# Patient Record
Sex: Female | Born: 1970 | Race: White | Hispanic: No | Marital: Married | Smoking: Current every day smoker
Health system: Southern US, Community
[De-identification: ages and names within clinical notes are randomized; demographics above are authoritative.]

## PROBLEM LIST (undated history)

## (undated) DIAGNOSIS — F32A Depression, unspecified: Secondary | ICD-10-CM

## (undated) DIAGNOSIS — IMO0002 Reserved for concepts with insufficient information to code with codable children: Secondary | ICD-10-CM

## (undated) DIAGNOSIS — F329 Major depressive disorder, single episode, unspecified: Secondary | ICD-10-CM

## (undated) DIAGNOSIS — F419 Anxiety disorder, unspecified: Secondary | ICD-10-CM

## (undated) DIAGNOSIS — Z5189 Encounter for other specified aftercare: Secondary | ICD-10-CM

## (undated) DIAGNOSIS — J45909 Unspecified asthma, uncomplicated: Secondary | ICD-10-CM

## (undated) DIAGNOSIS — C3490 Malignant neoplasm of unspecified part of unspecified bronchus or lung: Secondary | ICD-10-CM

## (undated) DIAGNOSIS — T7421XA Adult sexual abuse, confirmed, initial encounter: Secondary | ICD-10-CM

## (undated) DIAGNOSIS — R569 Unspecified convulsions: Secondary | ICD-10-CM

## (undated) DIAGNOSIS — C539 Malignant neoplasm of cervix uteri, unspecified: Secondary | ICD-10-CM

## (undated) HISTORY — DX: Encounter for other specified aftercare: Z51.89

## (undated) HISTORY — DX: Reserved for concepts with insufficient information to code with codable children: IMO0002

## (undated) HISTORY — PX: MIDDLE EAR SURGERY: SHX713

## (undated) HISTORY — PX: ABDOMINAL HYSTERECTOMY: SHX81

## (undated) HISTORY — DX: Malignant neoplasm of cervix uteri, unspecified: C53.9

## (undated) HISTORY — DX: Malignant neoplasm of unspecified part of unspecified bronchus or lung: C34.90

## (undated) HISTORY — PX: TONSILLECTOMY: SUR1361

## (undated) HISTORY — DX: Unspecified convulsions: R56.9

---

## 2005-04-27 ENCOUNTER — Emergency Department: Payer: Self-pay | Admitting: Emergency Medicine

## 2005-08-31 ENCOUNTER — Emergency Department: Payer: Self-pay

## 2006-03-14 ENCOUNTER — Emergency Department: Payer: Self-pay | Admitting: Emergency Medicine

## 2012-12-06 ENCOUNTER — Emergency Department: Payer: Self-pay

## 2012-12-06 LAB — URINALYSIS, COMPLETE
Bilirubin,UR: NEGATIVE
Ketone: NEGATIVE
Nitrite: NEGATIVE
Protein: NEGATIVE
RBC,UR: 2 /HPF (ref 0–5)
Specific Gravity: 1.02 (ref 1.003–1.030)
Squamous Epithelial: 1
WBC UR: 15 /HPF (ref 0–5)

## 2012-12-06 LAB — DRUG SCREEN, URINE
Amphetamines, Ur Screen: NEGATIVE (ref ?–1000)
Barbiturates, Ur Screen: NEGATIVE (ref ?–200)
Cocaine Metabolite,Ur ~~LOC~~: POSITIVE (ref ?–300)
Methadone, Ur Screen: NEGATIVE (ref ?–300)
Opiate, Ur Screen: NEGATIVE (ref ?–300)
Phencyclidine (PCP) Ur S: NEGATIVE (ref ?–25)

## 2012-12-06 LAB — PROTIME-INR
INR: 0.9
Prothrombin Time: 12.7 secs (ref 11.5–14.7)

## 2012-12-06 LAB — COMPREHENSIVE METABOLIC PANEL
Albumin: 3.7 g/dL (ref 3.4–5.0)
Alkaline Phosphatase: 52 U/L (ref 50–136)
BUN: 20 mg/dL — ABNORMAL HIGH (ref 7–18)
Calcium, Total: 9.1 mg/dL (ref 8.5–10.1)
Creatinine: 0.7 mg/dL (ref 0.60–1.30)
EGFR (African American): >60
Osmolality: 280 (ref 275–301)
Potassium: 4.2 mmol/L (ref 3.5–5.1)
SGOT(AST): 15 U/L (ref 15–37)
SGPT (ALT): 13 U/L (ref 12–78)
Total Protein: 7.3 g/dL (ref 6.4–8.2)

## 2012-12-06 LAB — ACETAMINOPHEN LEVEL: Acetaminophen: <2 ug/mL

## 2012-12-06 LAB — CBC
HCT: 40.2 % (ref 35.0–47.0)
HGB: 13.8 g/dL (ref 12.0–16.0)
MCH: 31.9 pg (ref 26.0–34.0)
MCV: 93 fL (ref 80–100)
Platelet: 226 10*3/uL (ref 150–440)
RBC: 4.33 10*6/uL (ref 3.80–5.20)
WBC: 5.6 10*3/uL (ref 3.6–11.0)

## 2012-12-06 LAB — AMMONIA: Ammonia, Plasma: 45 mcmol/L — ABNORMAL HIGH (ref 11–32)

## 2015-12-19 ENCOUNTER — Other Ambulatory Visit: Payer: Self-pay | Admitting: Neurology

## 2015-12-19 DIAGNOSIS — G40909 Epilepsy, unspecified, not intractable, without status epilepticus: Secondary | ICD-10-CM

## 2015-12-31 ENCOUNTER — Ambulatory Visit: Payer: Medicaid Other

## 2016-01-16 ENCOUNTER — Ambulatory Visit
Admission: RE | Admit: 2016-01-16 | Discharge: 2016-01-16 | Disposition: A | Discharge status: Home / Self Care | Payer: Medicaid Other | Source: Ambulatory Visit | Attending: Neurology | Admitting: Neurology

## 2016-01-16 ENCOUNTER — Encounter: Payer: Self-pay | Admitting: Radiology

## 2016-01-16 DIAGNOSIS — G40909 Epilepsy, unspecified, not intractable, without status epilepticus: Secondary | ICD-10-CM | POA: Insufficient documentation

## 2016-01-16 LAB — POCT I-STAT CREATININE: Creatinine, Ser: 0.8 mg/dL (ref 0.44–1.00)

## 2016-03-08 ENCOUNTER — Ambulatory Visit
Admission: RE | Admit: 2016-03-08 | Discharge: 2016-03-08 | Disposition: A | Discharge status: Home / Self Care | Payer: Medicaid Other | Source: Ambulatory Visit | Attending: Family Medicine | Admitting: Family Medicine

## 2016-03-08 ENCOUNTER — Other Ambulatory Visit: Payer: Self-pay | Admitting: Family Medicine

## 2016-03-08 DIAGNOSIS — R05 Cough: Secondary | ICD-10-CM | POA: Diagnosis present

## 2016-03-08 DIAGNOSIS — R918 Other nonspecific abnormal finding of lung field: Secondary | ICD-10-CM | POA: Insufficient documentation

## 2016-03-08 DIAGNOSIS — J90 Pleural effusion, not elsewhere classified: Secondary | ICD-10-CM | POA: Insufficient documentation

## 2016-03-08 DIAGNOSIS — R059 Cough, unspecified: Secondary | ICD-10-CM

## 2016-04-02 ENCOUNTER — Other Ambulatory Visit: Payer: Self-pay | Admitting: Family Medicine

## 2016-04-02 DIAGNOSIS — R05 Cough: Secondary | ICD-10-CM

## 2016-04-02 DIAGNOSIS — R9389 Abnormal findings on diagnostic imaging of other specified body structures: Secondary | ICD-10-CM

## 2016-04-02 DIAGNOSIS — R059 Cough, unspecified: Secondary | ICD-10-CM

## 2016-04-06 ENCOUNTER — Ambulatory Visit
Admission: RE | Admit: 2016-04-06 | Discharge: 2016-04-06 | Disposition: A | Discharge status: Home / Self Care | Payer: Medicaid Other | Source: Ambulatory Visit | Attending: Family Medicine | Admitting: Family Medicine

## 2016-04-06 DIAGNOSIS — R918 Other nonspecific abnormal finding of lung field: Secondary | ICD-10-CM | POA: Diagnosis not present

## 2016-04-06 DIAGNOSIS — R05 Cough: Secondary | ICD-10-CM | POA: Insufficient documentation

## 2016-04-06 DIAGNOSIS — R9389 Abnormal findings on diagnostic imaging of other specified body structures: Secondary | ICD-10-CM

## 2016-04-06 DIAGNOSIS — R938 Abnormal findings on diagnostic imaging of other specified body structures: Secondary | ICD-10-CM | POA: Diagnosis not present

## 2016-04-06 DIAGNOSIS — E279 Disorder of adrenal gland, unspecified: Secondary | ICD-10-CM | POA: Insufficient documentation

## 2016-04-06 DIAGNOSIS — R590 Localized enlarged lymph nodes: Secondary | ICD-10-CM | POA: Insufficient documentation

## 2016-04-06 DIAGNOSIS — R059 Cough, unspecified: Secondary | ICD-10-CM

## 2016-04-06 MED ORDER — IOPAMIDOL (ISOVUE-300) INJECTION 61%
75.0000 mL | Freq: Once | INTRAVENOUS | Status: AC | PRN
  Administered 2016-04-06: 75 mL via INTRAVENOUS

## 2016-04-13 ENCOUNTER — Other Ambulatory Visit: Payer: Self-pay | Admitting: *Deleted

## 2016-04-14 ENCOUNTER — Other Ambulatory Visit: Payer: Self-pay | Admitting: Family Medicine

## 2016-04-14 DIAGNOSIS — R918 Other nonspecific abnormal finding of lung field: Secondary | ICD-10-CM

## 2016-04-14 DIAGNOSIS — R9389 Abnormal findings on diagnostic imaging of other specified body structures: Secondary | ICD-10-CM

## 2016-04-15 ENCOUNTER — Telehealth: Payer: Self-pay | Admitting: *Deleted

## 2016-04-15 NOTE — Telephone Encounter (Signed)
Reviewed plan of care with patient and husband, Jeneen Rinks. [place] arranging cab to pick them up tomorrow at 6:30am, check [facility] for PET scan by 7am, prep for PET scan, seeing Dr. Janese Banks after PET and to contact us for any additional questions or concerns.

## 2016-04-16 ENCOUNTER — Inpatient Hospital Stay: Payer: Medicaid Other | Attending: Oncology | Admitting: Oncology

## 2016-04-16 ENCOUNTER — Ambulatory Visit
Admission: RE | Admit: 2016-04-16 | Discharge: 2016-04-16 | Disposition: A | Discharge status: Home / Self Care | Payer: Medicaid Other | Source: Ambulatory Visit | Attending: Family Medicine | Admitting: Family Medicine

## 2016-04-16 ENCOUNTER — Encounter: Payer: Self-pay | Admitting: Oncology

## 2016-04-16 ENCOUNTER — Inpatient Hospital Stay: Payer: Medicaid Other | Admitting: Oncology

## 2016-04-16 ENCOUNTER — Inpatient Hospital Stay: Payer: Medicaid Other

## 2016-04-16 ENCOUNTER — Encounter: Payer: Self-pay | Admitting: *Deleted

## 2016-04-16 VITALS — BP 114/70 | HR 84 | Temp 97.3°F | Resp 22 | Ht 63.78 in | Wt 138.8 lb

## 2016-04-16 DIAGNOSIS — F319 Bipolar disorder, unspecified: Secondary | ICD-10-CM | POA: Diagnosis not present

## 2016-04-16 DIAGNOSIS — F17210 Nicotine dependence, cigarettes, uncomplicated: Secondary | ICD-10-CM | POA: Insufficient documentation

## 2016-04-16 DIAGNOSIS — E279 Disorder of adrenal gland, unspecified: Secondary | ICD-10-CM | POA: Insufficient documentation

## 2016-04-16 DIAGNOSIS — F329 Major depressive disorder, single episode, unspecified: Secondary | ICD-10-CM | POA: Insufficient documentation

## 2016-04-16 DIAGNOSIS — R918 Other nonspecific abnormal finding of lung field: Secondary | ICD-10-CM | POA: Insufficient documentation

## 2016-04-16 DIAGNOSIS — R05 Cough: Secondary | ICD-10-CM | POA: Diagnosis not present

## 2016-04-16 DIAGNOSIS — R9389 Abnormal findings on diagnostic imaging of other specified body structures: Secondary | ICD-10-CM

## 2016-04-16 DIAGNOSIS — R938 Abnormal findings on diagnostic imaging of other specified body structures: Secondary | ICD-10-CM | POA: Insufficient documentation

## 2016-04-16 DIAGNOSIS — F419 Anxiety disorder, unspecified: Secondary | ICD-10-CM

## 2016-04-16 DIAGNOSIS — Z79899 Other long term (current) drug therapy: Secondary | ICD-10-CM | POA: Diagnosis not present

## 2016-04-16 DIAGNOSIS — G43909 Migraine, unspecified, not intractable, without status migrainosus: Secondary | ICD-10-CM | POA: Insufficient documentation

## 2016-04-16 DIAGNOSIS — R569 Unspecified convulsions: Secondary | ICD-10-CM | POA: Insufficient documentation

## 2016-04-16 DIAGNOSIS — R079 Chest pain, unspecified: Secondary | ICD-10-CM | POA: Diagnosis not present

## 2016-04-16 HISTORY — DX: Adult sexual abuse, confirmed, initial encounter: T74.21XA

## 2016-04-16 LAB — COMPREHENSIVE METABOLIC PANEL
ALT: 32 U/L (ref 14–54)
AST: 26 U/L (ref 15–41)
Albumin: 4 g/dL (ref 3.5–5.0)
Alkaline Phosphatase: 59 U/L (ref 38–126)
Anion gap: 4 — ABNORMAL LOW (ref 5–15)
BILIRUBIN TOTAL: 0.6 mg/dL (ref 0.3–1.2)
BUN: 9 mg/dL (ref 6–20)
CO2: 23 mmol/L (ref 22–32)
Calcium: 8.9 mg/dL (ref 8.9–10.3)
Chloride: 108 mmol/L (ref 101–111)
Creatinine, Ser: 0.65 mg/dL (ref 0.44–1.00)
Glucose, Bld: 117 mg/dL — ABNORMAL HIGH (ref 65–99)
POTASSIUM: 3.5 mmol/L (ref 3.5–5.1)
Sodium: 135 mmol/L (ref 135–145)
TOTAL PROTEIN: 7.6 g/dL (ref 6.5–8.1)

## 2016-04-16 LAB — LACTATE DEHYDROGENASE: LDH: 163 U/L (ref 98–192)

## 2016-04-16 LAB — CBC WITH DIFFERENTIAL/PLATELET
BASOS ABS: 0 10*3/uL (ref 0–0.1)
BASOS PCT: 1 %
Eosinophils Absolute: 0 10*3/uL (ref 0–0.7)
Eosinophils Relative: 1 %
HEMATOCRIT: 38.2 % (ref 35.0–47.0)
HEMOGLOBIN: 13.3 g/dL (ref 12.0–16.0)
Lymphocytes Relative: 36 %
Lymphs Abs: 2.1 10*3/uL (ref 1.0–3.6)
MCH: 33.4 pg (ref 26.0–34.0)
MCHC: 34.9 g/dL (ref 32.0–36.0)
MCV: 95.8 fL (ref 80.0–100.0)
MONOS PCT: 10 %
Monocytes Absolute: 0.6 10*3/uL (ref 0.2–0.9)
NEUTROS ABS: 3.1 10*3/uL (ref 1.4–6.5)
NEUTROS PCT: 52 %
Platelets: 258 10*3/uL (ref 150–440)
RBC: 3.99 MIL/uL (ref 3.80–5.20)
RDW: 13.9 % (ref 11.5–14.5)
WBC: 5.9 10*3/uL (ref 3.6–11.0)

## 2016-04-16 LAB — GLUCOSE, CAPILLARY: GLUCOSE-CAPILLARY: 84 mg/dL (ref 65–99)

## 2016-04-16 MED ORDER — FLUDEOXYGLUCOSE F - 18 (FDG) INJECTION
12.6900 | Freq: Once | INTRAVENOUS | Status: AC | PRN
  Administered 2016-04-16: 12.69 via INTRAVENOUS

## 2016-04-16 MED ORDER — LORAZEPAM 0.5 MG PO TABS: 0.5000 mg | ORAL_TABLET | Freq: Once | ORAL | 0 refills | Status: AC

## 2016-04-16 MED ORDER — QUETIAPINE FUMARATE 25 MG PO TABS
25.0000 mg | ORAL_TABLET | Freq: Every day | ORAL | 0 refills | Status: DC
Start: 2016-04-16 — End: 2016-06-01

## 2016-04-16 MED ORDER — QUETIAPINE FUMARATE 25 MG PO TABS: 25.0000 mg | ORAL_TABLET | Freq: Every day | ORAL | 0 refills | Status: DC

## 2016-04-16 NOTE — Progress Notes (Signed)
  Oncology Nurse Navigator Documentation  Navigator Location: [facility] (04/16/16 1200) Referral date to RadOnc/MedOnc: 04/16/16 (04/16/16 1200) )Navigator Encounter Type: Clinic/MDC (04/16/16 1200)   Abnormal Finding Date: 04/06/16 (04/16/16 1200)           Multidisiplinary Clinic Date: 04/16/16 (04/16/16 1200) Multidisiplinary Clinic Type: Thoracic (04/16/16 1200)       Barriers/Navigation Needs: Transportation;Financial;Coordination of Care (04/16/16 1200)   Interventions: Transportation;Coordination of Care;Medication Assistance;Psycho-social support (04/16/16 1200)   Coordination of Care: Appts (04/16/16 1200)      Pt seen in [place] with Dr. Janese Banks for mediastinal mass. Pt accomapanied with husband. All questions and concerns answered while in clinic. Contact information given to pt and husband and encouraged to contact me if have any further questions or needs. Referral sent to [facility] to help with medications and financial concerns. All scheduled appts reviewed with the patient and her husband prior to leaving clinic. Pt's husband verbalized understanding.   .    Acuity: Level 4 (04/16/16 1200)       Acuity Level 4: Uninsured or under insured;Low health literacy;Psychological issues;Ongoing guidance, education and support provided throughout the patient's treatment (04/16/16 1200) Time Spent with Patient: 60 (04/16/16 1200)

## 2016-04-16 NOTE — Progress Notes (Signed)
Patient reports pain in back and abdomen at 10 and is asking for something for pain, anxiety, and coughing. Not getting in rest because can't sleep from pain.
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
## 2016-04-17 LAB — AFP TUMOR MARKER: AFP-Tumor Marker: 2.8 ng/mL (ref 0.0–8.3)

## 2016-04-18 DIAGNOSIS — C3490 Malignant neoplasm of unspecified part of unspecified bronchus or lung: Secondary | ICD-10-CM

## 2016-04-18 HISTORY — DX: Malignant neoplasm of unspecified part of unspecified bronchus or lung: C34.90

## 2016-04-20 ENCOUNTER — Encounter: Payer: Self-pay | Admitting: Pulmonary Disease

## 2016-04-20 ENCOUNTER — Ambulatory Visit (INDEPENDENT_AMBULATORY_CARE_PROVIDER_SITE_OTHER): Payer: Medicaid Other | Admitting: Pulmonary Disease

## 2016-04-20 ENCOUNTER — Encounter
Admission: RE | Disposition: A | Discharge status: Home / Self Care | Payer: Self-pay | Source: Ambulatory Visit | Attending: Pulmonary Disease

## 2016-04-20 ENCOUNTER — Ambulatory Visit
Admission: RE | Admit: 2016-04-20 | Discharge: 2016-04-20 | Disposition: A | Discharge status: Home / Self Care | Payer: Medicaid Other | Source: Ambulatory Visit | Attending: Pulmonary Disease | Admitting: Pulmonary Disease

## 2016-04-20 VITALS — BP 110/68 | HR 91 | Wt 143.0 lb

## 2016-04-20 DIAGNOSIS — Z87828 Personal history of other (healed) physical injury and trauma: Secondary | ICD-10-CM | POA: Diagnosis not present

## 2016-04-20 DIAGNOSIS — Z8541 Personal history of malignant neoplasm of cervix uteri: Secondary | ICD-10-CM | POA: Diagnosis not present

## 2016-04-20 DIAGNOSIS — C3412 Malignant neoplasm of upper lobe, left bronchus or lung: Secondary | ICD-10-CM | POA: Insufficient documentation

## 2016-04-20 DIAGNOSIS — F17210 Nicotine dependence, cigarettes, uncomplicated: Secondary | ICD-10-CM | POA: Diagnosis not present

## 2016-04-20 DIAGNOSIS — R918 Other nonspecific abnormal finding of lung field: Secondary | ICD-10-CM

## 2016-04-20 DIAGNOSIS — F17200 Nicotine dependence, unspecified, uncomplicated: Secondary | ICD-10-CM

## 2016-04-20 HISTORY — PX: FLEXIBLE BRONCHOSCOPY: SHX5094

## 2016-04-20 SURGERY — BRONCHOSCOPY, FLEXIBLE
Anesthesia: Moderate Sedation

## 2016-04-20 MED ORDER — HYDROCOD POLST-CPM POLST ER 10-8 MG/5ML PO SUER: 5.0000 mL | Freq: Two times a day (BID) | ORAL | 0 refills | Status: DC | PRN

## 2016-04-20 MED ORDER — MIDAZOLAM HCL 5 MG/5ML IJ SOLN
INTRAMUSCULAR | Status: DC
Start: 2016-04-20 — End: 2016-04-20
  Filled 2016-04-20: qty 5

## 2016-04-20 MED ORDER — FENTANYL CITRATE (PF) 100 MCG/2ML IJ SOLN
INTRAMUSCULAR | Status: DC
Start: 2016-04-20 — End: 2016-04-20
  Filled 2016-04-20: qty 4

## 2016-04-20 MED ORDER — PHENYLEPHRINE HCL 0.25 % NA SOLN
2.0000 | Freq: Four times a day (QID) | NASAL | Status: DC | PRN
  Filled 2016-04-20: qty 15

## 2016-04-20 MED ORDER — MIDAZOLAM HCL 2 MG/2ML IJ SOLN
INTRAMUSCULAR | Status: AC | PRN
  Administered 2016-04-20 (×2): 2 mg via INTRAVENOUS

## 2016-04-20 MED ORDER — BUTAMBEN-TETRACAINE-BENZOCAINE 2-2-14 % EX AERO
1.0000 | INHALATION_SPRAY | Freq: Once | CUTANEOUS | Status: DC
  Filled 2016-04-20: qty 20

## 2016-04-20 MED ORDER — BUTAMBEN-TETRACAINE-BENZOCAINE 2-2-14 % EX AERO
INHALATION_SPRAY | CUTANEOUS | Status: AC
  Filled 2016-04-20: qty 20

## 2016-04-20 MED ORDER — FENTANYL CITRATE (PF) 100 MCG/2ML IJ SOLN
INTRAMUSCULAR | Status: AC | PRN
  Administered 2016-04-20: 25 ug via INTRAVENOUS
  Administered 2016-04-20: 50 ug via INTRAVENOUS

## 2016-04-20 MED ORDER — LIDOCAINE HCL 2 % EX GEL: 1.0000 "application " | Freq: Once | CUTANEOUS | Status: DC

## 2016-04-20 NOTE — Patient Instructions (Signed)
Bronchoscopy scheduled for today Continue to work on smoking cessation We will contact you with final biopsy results and what to do next

## 2016-04-20 NOTE — Procedures (Signed)
Indication:   Lung mass  Premedication:   Fentanyl 75 mcg Midaz 4 mg  Anesthesia: Topical to nose and throat 40 cc of 1% lidocaine used during the course of procedure  Procedure: After adequate sedation and anesthesia, the bronchoscope was introduced via the L naris and advanced into the posterior pharynx. Further anesthesia was obtained with 1% lidocaine and the scope was advanced into the trachea. Complete airway anesthesia was achieved with 1% lidocaine and a thorough airway examination was performed. This revealed the following findings:  Findings:  Upper airway - normal anatomy. Cords moved symmetrically Tracheobronchial anatomy - normal Bronchial mucosa - severe chronic bronchitic changes Other - no endobronchial tumors, masses   Specimens:   Cytology brushings from LUL X 2 passes Transbronchial biopsy  from LUL X 1 pass  Complications: Procedure was terminated prematurely due to excessive cough and pt combativeness Initial tough prep on TBBx was c/w malignancy   Merton Border, MD PCCM service Mobile [phone] Pager [phone]  04/20/2016 1:38 PM

## 2016-04-20 NOTE — Addendum Note (Signed)
Addended by: Alva Garnet, Lylian Sanagustin B on: 04/20/2016 03:30 PM   Modules accepted: Orders
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
## 2016-04-20 NOTE — Addendum Note (Signed)
Addended by: Merton Border B on: 04/20/2016 02:18 PM   Modules accepted: Orders

## 2016-04-21 ENCOUNTER — Encounter: Payer: Self-pay | Admitting: Pulmonary Disease

## 2016-04-21 ENCOUNTER — Other Ambulatory Visit: Payer: Self-pay | Admitting: Pathology

## 2016-04-22 ENCOUNTER — Ambulatory Visit: Payer: Medicaid Other

## 2016-04-22 ENCOUNTER — Other Ambulatory Visit: Payer: Self-pay | Admitting: *Deleted

## 2016-04-22 ENCOUNTER — Telehealth: Payer: Self-pay | Admitting: *Deleted

## 2016-04-22 DIAGNOSIS — R918 Other nonspecific abnormal finding of lung field: Secondary | ICD-10-CM

## 2016-04-22 LAB — SURGICAL PATHOLOGY

## 2016-04-22 LAB — CYTOLOGY - NON PAP

## 2016-04-22 NOTE — Telephone Encounter (Signed)
Spoke with patient and informed her of her biopsy results. Pt stated continues to have difficulty breathing with shortness of breath. The pt was evaluated yesterday at psychiatric facility but was unable to give me the name of the facility or the doctor she saw. She stated that her husband knows all the information. She did mention that she will follow up with psychiatry on 4/19. Pt did not receive results of the biopsy well and was very anxious. Pt understands the severity of her diagnosis and that treatment planning will be discussed at her appt on Tuesday after we get her results from her head CT.

## 2016-04-23 ENCOUNTER — Telehealth: Payer: Self-pay | Admitting: *Deleted

## 2016-04-23 NOTE — Telephone Encounter (Signed)
Spoke with patient's husband regarding diagnosis and had lengthy discussion regarding treatment, prognosis, and risk of recurrence. Informed, Jeneen Rinks, that she has an aggressive form of lung cancer and will require treatment that might include radiation and chemotherapy. Informed Jeneen Rinks that at their next appt they will meet with Elease Etienne who will go over resources available to them to better assist them during this difficult time. Instructed him to let us know if we can help him or the patient in any way. Jeneen Rinks verbalized understanding.  Ronny Flurry if he knows more information regarding where the patient will be receiving psychiatric treatment. He states the facility name is "[facility]" but he does not know where they are located or their contact number. He stated that [facility] center will know. I will attempt to call them today to get more information regarding the pt's psychiatric care.

## 2016-04-26 ENCOUNTER — Ambulatory Visit
Admission: RE | Admit: 2016-04-26 | Discharge: 2016-04-26 | Disposition: A | Discharge status: Home / Self Care | Payer: Medicaid Other | Source: Ambulatory Visit | Attending: Oncology | Admitting: Oncology

## 2016-04-26 ENCOUNTER — Telehealth: Payer: Self-pay | Admitting: *Deleted

## 2016-04-26 DIAGNOSIS — R918 Other nonspecific abnormal finding of lung field: Secondary | ICD-10-CM

## 2016-04-26 DIAGNOSIS — F419 Anxiety disorder, unspecified: Secondary | ICD-10-CM

## 2016-04-26 DIAGNOSIS — R938 Abnormal findings on diagnostic imaging of other specified body structures: Secondary | ICD-10-CM | POA: Diagnosis not present

## 2016-04-26 DIAGNOSIS — R9389 Abnormal findings on diagnostic imaging of other specified body structures: Secondary | ICD-10-CM

## 2016-04-26 HISTORY — DX: Unspecified asthma, uncomplicated: J45.909

## 2016-04-26 MED ORDER — IOPAMIDOL (ISOVUE-300) INJECTION 61%
75.0000 mL | Freq: Once | INTRAVENOUS | Status: AC | PRN
  Administered 2016-04-26: 75 mL via INTRAVENOUS

## 2016-04-26 NOTE — Telephone Encounter (Signed)
Called [facility] [facility] to obtain more information regarding pt's psychiatric evaluation. Pt has completed 2 evaluations and scheduled for next appointment on 4/19. This is the soonest appointment that the facility could offer at this time. Medical records will be faxed to our office as well to put in pt's chart.

## 2016-04-27 ENCOUNTER — Telehealth: Payer: Self-pay | Admitting: *Deleted

## 2016-04-27 ENCOUNTER — Inpatient Hospital Stay: Payer: Medicaid Other | Attending: Oncology | Admitting: Oncology

## 2016-04-27 ENCOUNTER — Encounter: Payer: Self-pay | Admitting: Oncology

## 2016-04-27 ENCOUNTER — Encounter: Payer: Self-pay | Admitting: *Deleted

## 2016-04-27 ENCOUNTER — Ambulatory Visit
Admission: RE | Admit: 2016-04-27 | Discharge: 2016-04-27 | Disposition: A | Discharge status: Home / Self Care | Payer: Medicaid Other | Source: Ambulatory Visit | Attending: Radiation Oncology | Admitting: Radiation Oncology

## 2016-04-27 ENCOUNTER — Other Ambulatory Visit: Payer: Self-pay | Admitting: *Deleted

## 2016-04-27 VITALS — BP 109/75 | HR 76 | Temp 97.5°F | Resp 18 | Wt 141.8 lb

## 2016-04-27 DIAGNOSIS — Z510 Encounter for antineoplastic radiation therapy: Secondary | ICD-10-CM | POA: Diagnosis not present

## 2016-04-27 DIAGNOSIS — R0789 Other chest pain: Secondary | ICD-10-CM | POA: Insufficient documentation

## 2016-04-27 DIAGNOSIS — M545 Low back pain: Secondary | ICD-10-CM | POA: Insufficient documentation

## 2016-04-27 DIAGNOSIS — Z8711 Personal history of peptic ulcer disease: Secondary | ICD-10-CM | POA: Diagnosis not present

## 2016-04-27 DIAGNOSIS — C3412 Malignant neoplasm of upper lobe, left bronchus or lung: Secondary | ICD-10-CM | POA: Diagnosis not present

## 2016-04-27 DIAGNOSIS — E279 Disorder of adrenal gland, unspecified: Secondary | ICD-10-CM | POA: Insufficient documentation

## 2016-04-27 DIAGNOSIS — C3492 Malignant neoplasm of unspecified part of left bronchus or lung: Secondary | ICD-10-CM

## 2016-04-27 DIAGNOSIS — R634 Abnormal weight loss: Secondary | ICD-10-CM | POA: Diagnosis not present

## 2016-04-27 DIAGNOSIS — R079 Chest pain, unspecified: Secondary | ICD-10-CM | POA: Diagnosis not present

## 2016-04-27 DIAGNOSIS — R51 Headache: Secondary | ICD-10-CM | POA: Insufficient documentation

## 2016-04-27 DIAGNOSIS — R32 Unspecified urinary incontinence: Secondary | ICD-10-CM | POA: Insufficient documentation

## 2016-04-27 DIAGNOSIS — C3490 Malignant neoplasm of unspecified part of unspecified bronchus or lung: Secondary | ICD-10-CM | POA: Insufficient documentation

## 2016-04-27 DIAGNOSIS — F319 Bipolar disorder, unspecified: Secondary | ICD-10-CM | POA: Insufficient documentation

## 2016-04-27 DIAGNOSIS — K219 Gastro-esophageal reflux disease without esophagitis: Secondary | ICD-10-CM | POA: Diagnosis not present

## 2016-04-27 DIAGNOSIS — F329 Major depressive disorder, single episode, unspecified: Secondary | ICD-10-CM | POA: Diagnosis not present

## 2016-04-27 DIAGNOSIS — R569 Unspecified convulsions: Secondary | ICD-10-CM | POA: Insufficient documentation

## 2016-04-27 DIAGNOSIS — F418 Other specified anxiety disorders: Secondary | ICD-10-CM | POA: Diagnosis not present

## 2016-04-27 DIAGNOSIS — Z5111 Encounter for antineoplastic chemotherapy: Secondary | ICD-10-CM | POA: Insufficient documentation

## 2016-04-27 DIAGNOSIS — F419 Anxiety disorder, unspecified: Secondary | ICD-10-CM | POA: Diagnosis not present

## 2016-04-27 DIAGNOSIS — F17210 Nicotine dependence, cigarettes, uncomplicated: Secondary | ICD-10-CM | POA: Insufficient documentation

## 2016-04-27 DIAGNOSIS — J069 Acute upper respiratory infection, unspecified: Secondary | ICD-10-CM | POA: Insufficient documentation

## 2016-04-27 DIAGNOSIS — R05 Cough: Secondary | ICD-10-CM | POA: Diagnosis not present

## 2016-04-27 DIAGNOSIS — R590 Localized enlarged lymph nodes: Secondary | ICD-10-CM | POA: Diagnosis not present

## 2016-04-27 DIAGNOSIS — Z8541 Personal history of malignant neoplasm of cervix uteri: Secondary | ICD-10-CM | POA: Insufficient documentation

## 2016-04-27 DIAGNOSIS — Z79899 Other long term (current) drug therapy: Secondary | ICD-10-CM | POA: Insufficient documentation

## 2016-04-27 MED ORDER — DEXAMETHASONE 4 MG PO TABS: ORAL_TABLET | ORAL | 1 refills | Status: DC

## 2016-04-27 MED ORDER — PROCHLORPERAZINE MALEATE 10 MG PO TABS: 10.0000 mg | ORAL_TABLET | Freq: Four times a day (QID) | ORAL | 1 refills | Status: DC | PRN

## 2016-04-27 MED ORDER — ONDANSETRON HCL 8 MG PO TABS: 8.0000 mg | ORAL_TABLET | Freq: Two times a day (BID) | ORAL | 1 refills | Status: DC | PRN

## 2016-04-27 MED ORDER — LIDOCAINE-PRILOCAINE 2.5-2.5 % EX CREA: TOPICAL_CREAM | CUTANEOUS | 3 refills | Status: AC

## 2016-04-27 MED ORDER — PANTOPRAZOLE SODIUM 20 MG PO TBEC: 20.0000 mg | DELAYED_RELEASE_TABLET | Freq: Every day | ORAL | 3 refills | Status: DC

## 2016-04-27 MED ORDER — LORAZEPAM 0.5 MG PO TABS: 0.5000 mg | ORAL_TABLET | Freq: Four times a day (QID) | ORAL | 0 refills | Status: DC | PRN
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
## 2016-04-27 NOTE — Progress Notes (Signed)
  Oncology Nurse Navigator Documentation  Navigator Location: [facility] (04/27/16 1200)   )Navigator Encounter Type: [facility] Follow-up (04/27/16 1200)                         Barriers/Navigation Needs: Transportation;Financial;Coordination of Care (04/27/16 1200)   Interventions: Transportation;Coordination of Care;Medication Assistance (04/27/16 1200)   Coordination of Care: Appts (04/27/16 1200)      Specialty Items/DME:  (adult diapers) (04/27/16 1200)      Met with patient during treatment planning visit with Dr. Janese Banks and Dr. Baruch Gouty. All questions were answered during visit. Pt voiced several needs for transportation, food, medical supplies, and other financial hardships. Elease Etienne spoke with patient and his contact information was given to the patient. All appts were reviewed with the pt's spouse after visit. Transportation will be coordinated as needed. Instructed pt's spouse to notify myself or Elease Etienne if needs assistance with transportation. No further questions or needs. Pt and pt's spouse were instructed to contact me if have any further needs or questions.      Time Spent with Patient: 90 (04/27/16 1200)

## 2016-04-27 NOTE — Telephone Encounter (Signed)
Called husband and told him port placement will be Friday. [facility] 11 am procedure. NPO after midnight and has to have someone to drive her home.  Answered all his questions and he will check with [employer] to get her transportation and if he runs into problems he will call us back. Told him about pre meds sent to [facility] and explained the emla cream and when to put it on.

## 2016-04-27 NOTE — Patient Instructions (Signed)
Cisplatin injection  What is this medicine?  CISPLATIN (SIS pla tin) is a chemotherapy drug. It targets fast dividing cells, like cancer cells, and causes these cells to die. This medicine is used to treat many types of cancer like bladder, ovarian, and testicular cancers.  This medicine may be used for other purposes; ask your health care provider or pharmacist if you have questions.  COMMON BRAND NAME(S): Platinol, Platinol -AQ  What should I tell my health care provider before I take this medicine?  They need to know if you have any of these conditions:  -blood disorders  -hearing problems  -kidney disease  -recent or ongoing radiation therapy  -an unusual or allergic reaction to cisplatin, carboplatin, other chemotherapy, other medicines, foods, dyes, or preservatives  -pregnant or trying to get pregnant  -breast-feeding  How should I use this medicine?  This drug is given as an infusion into a vein. It is administered in a hospital or clinic by a specially trained health care professional.  Talk to your pediatrician regarding the use of this medicine in children. Special care may be needed.  Overdosage: If you think you have taken too much of this medicine contact a poison control center or emergency room at once.  NOTE: This medicine is only for you. Do not share this medicine with others.  What if I miss a dose?  It is important not to miss a dose. Call your doctor or health care professional if you are unable to keep an appointment.  What may interact with this medicine?  -dofetilide  -foscarnet  -medicines for seizures  -medicines to increase blood counts like filgrastim, pegfilgrastim, sargramostim  -probenecid  -pyridoxine used with altretamine  -rituximab  -some antibiotics like amikacin, gentamicin, neomycin, polymyxin B, streptomycin, tobramycin  -sulfinpyrazone  -vaccines  -zalcitabine  Talk to your doctor or health care professional before taking any of these  medicines:  -acetaminophen  -aspirin  -ibuprofen  -ketoprofen  -naproxen  This list may not describe all possible interactions. Give your health care provider a list of all the medicines, herbs, non-prescription drugs, or dietary supplements you use. Also tell them if you smoke, drink alcohol, or use illegal drugs. Some items may interact with your medicine.  What should I watch for while using this medicine?  Your condition will be monitored carefully while you are receiving this medicine. You will need important blood work done while you are taking this medicine.  This drug may make you feel generally unwell. This is not uncommon, as chemotherapy can affect healthy cells as well as cancer cells. Report any side effects. Continue your course of treatment even though you feel ill unless your doctor tells you to stop.  In some cases, you may be given additional medicines to help with side effects. Follow all directions for their use.  Call your doctor or health care professional for advice if you get a fever, chills or sore throat, or other symptoms of a cold or flu. Do not treat yourself. This drug decreases your body's ability to fight infections. Try to avoid being around people who are sick.  This medicine may increase your risk to bruise or bleed. Call your doctor or health care professional if you notice any unusual bleeding.  Be careful brushing and flossing your teeth or using a toothpick because you may get an infection or bleed more easily. If you have any dental work done, tell your dentist you are receiving this medicine.  Avoid taking products   that contain aspirin, acetaminophen, ibuprofen, naproxen, or ketoprofen unless instructed by your doctor. These medicines may hide a fever.  Do not become pregnant while taking this medicine. Women should inform their doctor if they wish to become pregnant or think they might be pregnant. There is a potential for serious side effects to an unborn child. Talk to  your health care professional or pharmacist for more information. Do not breast-feed an infant while taking this medicine.  Drink fluids as directed while you are taking this medicine. This will help protect your kidneys.  Call your doctor or health care professional if you get diarrhea. Do not treat yourself.  What side effects may I notice from receiving this medicine?  Side effects that you should report to your doctor or health care professional as soon as possible:  -allergic reactions like skin rash, itching or hives, swelling of the face, lips, or tongue  -signs of infection - fever or chills, cough, sore throat, pain or difficulty passing urine  -signs of decreased platelets or bleeding - bruising, pinpoint red spots on the skin, black, tarry stools, nosebleeds  -signs of decreased red blood cells - unusually weak or tired, fainting spells, lightheadedness  -breathing problems  -changes in hearing  -gout pain  -low blood counts - This drug may decrease the number of white blood cells, red blood cells and platelets. You may be at increased risk for infections and bleeding.  -nausea and vomiting  -pain, swelling, redness or irritation at the injection site  -pain, tingling, numbness in the hands or feet  -problems with balance, movement  -trouble passing urine or change in the amount of urine  Side effects that usually do not require medical attention (report to your doctor or health care professional if they continue or are bothersome):  -changes in vision  -loss of appetite  -metallic taste in the mouth or changes in taste  This list may not describe all possible side effects. Call your doctor for medical advice about side effects. You may report side effects to FDA at 1-800-FDA-1088.  Where should I keep my medicine?  This drug is given in a hospital or clinic and will not be stored at home.  NOTE: This sheet is a summary. It may not cover all possible information. If you have questions about this medicine,  talk to your doctor, pharmacist, or health care provider.   2018 Elsevier/Gold Standard (2007-04-11 14:40:54)  Etoposide, VP-16 injection  What is this medicine?  ETOPOSIDE, VP-16 (e toe POE side) is a chemotherapy drug. It is used to treat testicular cancer, lung cancer, and other cancers.  This medicine may be used for other purposes; ask your health care provider or pharmacist if you have questions.  COMMON BRAND NAME(S): Etopophos, Toposar, VePesid  What should I tell my health care provider before I take this medicine?  They need to know if you have any of these conditions:  -infection  -kidney disease  -liver disease  -low blood counts, like low white cell, platelet, or red cell counts  -an unusual or allergic reaction to etoposide, other medicines, foods, dyes, or preservatives  -pregnant or trying to get pregnant  -breast-feeding  How should I use this medicine?  This medicine is for infusion into a vein. It is administered in a hospital or clinic by a specially trained health care professional.  Talk to your pediatrician regarding the use of this medicine in children. Special care may be needed.  Overdosage: If you think   you have taken too much of this medicine contact a poison control center or emergency room at once.  NOTE: This medicine is only for you. Do not share this medicine with others.  What if I miss a dose?  It is important not to miss your dose. Call your doctor or health care professional if you are unable to keep an appointment.  What may interact with this medicine?  -aspirin  -certain medications for seizures like carbamazepine, phenobarbital, phenytoin, valproic acid  -cyclosporine  -levamisole  -warfarin  This list may not describe all possible interactions. Give your health care provider a list of all the medicines, herbs, non-prescription drugs, or dietary supplements you use. Also tell them if you smoke, drink alcohol, or use illegal drugs. Some items may interact with your  medicine.  What should I watch for while using this medicine?  Visit your doctor for checks on your progress. This drug may make you feel generally unwell. This is not uncommon, as chemotherapy can affect healthy cells as well as cancer cells. Report any side effects. Continue your course of treatment even though you feel ill unless your doctor tells you to stop.  In some cases, you may be given additional medicines to help with side effects. Follow all directions for their use.  Call your doctor or health care professional for advice if you get a fever, chills or sore throat, or other symptoms of a cold or flu. Do not treat yourself. This drug decreases your body's ability to fight infections. Try to avoid being around people who are sick.  This medicine may increase your risk to bruise or bleed. Call your doctor or health care professional if you notice any unusual bleeding.  Talk to your doctor about your risk of cancer. You may be more at risk for certain types of cancers if you take this medicine.  Do not become pregnant while taking this medicine or for at least 6 months after stopping it. Women should inform their doctor if they wish to become pregnant or think they might be pregnant. Women of child-bearing potential will need to have a negative pregnancy test before starting this medicine. There is a potential for serious side effects to an unborn child. Talk to your health care professional or pharmacist for more information. Do not breast-feed an infant while taking this medicine.  Men must use a latex condom during sexual contact with a woman while taking this medicine and for at least 4 months after stopping it. A latex condom is needed even if you have had a vasectomy. Contact your doctor right away if your partner becomes pregnant. Do not donate sperm while taking this medicine and for at least 4 months after you stop taking this medicine. Men should inform their doctors if they wish to father a child.  This medicine may lower sperm counts.  What side effects may I notice from receiving this medicine?  Side effects that you should report to your doctor or health care professional as soon as possible:  -allergic reactions like skin rash, itching or hives, swelling of the face, lips, or tongue  -low blood counts - this medicine may decrease the number of white blood cells, red blood cells and platelets. You may be at increased risk for infections and bleeding.  -signs of infection - fever or chills, cough, sore throat, pain or difficulty passing urine  -signs of decreased platelets or bleeding - bruising, pinpoint red spots on the skin, black, tarry stools,   blood in the urine  -signs of decreased red blood cells - unusually weak or tired, fainting spells, lightheadedness  -breathing problems  -changes in vision  -mouth or throat sores or ulcers  -pain, redness, swelling or irritation at the injection site  -pain, tingling, numbness in the hands or feet  -redness, blistering, peeling or loosening of the skin, including inside the mouth  -seizures  -vomiting  Side effects that usually do not require medical attention (report to your doctor or health care professional if they continue or are bothersome):  -diarrhea  -hair loss  -loss of appetite  -nausea  -stomach pain  This list may not describe all possible side effects. Call your doctor for medical advice about side effects. You may report side effects to FDA at 1-800-FDA-1088.  Where should I keep my medicine?  This drug is given in a hospital or clinic and will not be stored at home.  NOTE: This sheet is a summary. It may not cover all possible information. If you have questions about this medicine, talk to your doctor, pharmacist, or health care provider.   2018 Elsevier/Gold Standard (2014-12-27 11:53:23)

## 2016-04-27 NOTE — Progress Notes (Signed)
START ON PATHWAY REGIMEN - Small Cell Lung     A cycle is every 21 days:     Etoposide      Cisplatin   **Always confirm dose/schedule in your pharmacy ordering system**    Patient Characteristics: Limited Stage, First Line Stage Grouping: Limited AJCC T Category: T4 AJCC N Category: N2 AJCC M Category: M0 AJCC 8 Stage Grouping: IIIB Line of therapy: First Line Would you be surprised if this patient died  in the next year? I would be surprised if this patient died in the next year  Intent of Therapy: Curative Intent, Discussed with Patient
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
## 2016-04-28 ENCOUNTER — Ambulatory Visit
Admission: RE | Admit: 2016-04-28 | Discharge: 2016-04-28 | Disposition: A | Discharge status: Home / Self Care | Payer: Medicaid Other | Source: Ambulatory Visit | Attending: Radiation Oncology | Admitting: Radiation Oncology

## 2016-04-28 ENCOUNTER — Telehealth: Payer: Self-pay | Admitting: *Deleted

## 2016-04-28 ENCOUNTER — Other Ambulatory Visit: Payer: Self-pay | Admitting: *Deleted

## 2016-04-28 DIAGNOSIS — C3492 Malignant neoplasm of unspecified part of left bronchus or lung: Secondary | ICD-10-CM

## 2016-04-28 DIAGNOSIS — Z510 Encounter for antineoplastic radiation therapy: Secondary | ICD-10-CM | POA: Diagnosis not present

## 2016-04-28 NOTE — Telephone Encounter (Signed)
Called to get records faxed from psychiatrist. Informed that pt will need to sign release to have records faxed to our office. Will get patient to sign form when comes to chemo class and fax back to [place] at [facility].

## 2016-04-29 ENCOUNTER — Other Ambulatory Visit: Payer: Self-pay | Admitting: Radiology

## 2016-04-29 ENCOUNTER — Other Ambulatory Visit: Payer: Self-pay | Admitting: *Deleted

## 2016-04-29 ENCOUNTER — Inpatient Hospital Stay: Payer: Medicaid Other

## 2016-04-29 ENCOUNTER — Encounter: Payer: Self-pay | Admitting: *Deleted

## 2016-04-29 ENCOUNTER — Other Ambulatory Visit: Payer: Self-pay | Admitting: Physician Assistant

## 2016-04-29 DIAGNOSIS — C3492 Malignant neoplasm of unspecified part of left bronchus or lung: Secondary | ICD-10-CM

## 2016-04-29 MED ORDER — OXYCODONE HCL 5 MG PO TABS: 5.0000 mg | ORAL_TABLET | ORAL | 0 refills | Status: DC | PRN

## 2016-04-29 NOTE — Progress Notes (Signed)
  Oncology Nurse Navigator Documentation  Navigator Location: [facility] (04/29/16 1500)   )                      Patient Visit Type: Walk-in (04/29/16 1500)   Barriers/Navigation Needs: Financial;Transportation;Coordination of Care (04/29/16 1500) Education: Pain/ Symptom Management;Concerns with Finances/ Eligibility;Transport During Treatment (04/29/16 1500)      pt came in for chemo class today and had several needs. Verbalized need for more minutes on phone, needs transportation for appts, help with prescriptions, requests prescription for pain medications and adult pullups. Informed pt that Barnabas Lister will assist her with her phone and the prescription for adult pullups. Informed pt that will need [facility] to transport her to her port placement tomorrow which is already arranged per her husband. She is scheduled to ride the [place] for the cancer center starting next week when her treatment starts. We will arrange for her prescriptions be transferred from [place] to [place] in order to help with copays. Pt and her husband were instructed that need to find their own transportation to get prescriptions and that our Lucianne Lei will not be able to take her to the pharmacy. Prescription for pain medication was given to patient while in the clinic. Pt has allergy to codeine listed and prescription was written for oxycodone. Asked pt if has ever taken oxycodone before and she stated she has and had no problems taking it in the past. She verbalized that she did not have an allergy to codeine. Codeine allergy was removed from her list of allergies at this time. Pt was instructed that will need to come by cancer center tomorrow to pick up pullups that will be delivered to [place] from [facility] medical supply. Pt verbalized understanding. Informed pt to call with any further questions or needs. All appts were given to patient and reviewed with patient while in clinic. No further questions.    While in clinic, pt signed release to obtain records from [facility] for Dr. Janese Banks to review her psychiatric evaluations/management. Release was faxed to [facility] at [facility]. Awaiting records to be faxed at this time.                  Time Spent with Patient: 60 (04/29/16 1500)

## 2016-04-29 NOTE — Telephone Encounter (Signed)
[facility] CALLED TO PHARMACY PER ORDERS.

## 2016-04-30 ENCOUNTER — Ambulatory Visit
Admission: RE | Admit: 2016-04-30 | Discharge: 2016-04-30 | Disposition: A | Discharge status: Home / Self Care | Payer: Medicaid Other | Source: Ambulatory Visit | Attending: Oncology | Admitting: Oncology

## 2016-04-30 ENCOUNTER — Encounter: Payer: Self-pay | Admitting: Interventional Radiology

## 2016-04-30 DIAGNOSIS — J45909 Unspecified asthma, uncomplicated: Secondary | ICD-10-CM | POA: Insufficient documentation

## 2016-04-30 DIAGNOSIS — C3492 Malignant neoplasm of unspecified part of left bronchus or lung: Secondary | ICD-10-CM

## 2016-04-30 DIAGNOSIS — Z880 Allergy status to penicillin: Secondary | ICD-10-CM | POA: Diagnosis not present

## 2016-04-30 DIAGNOSIS — C3490 Malignant neoplasm of unspecified part of unspecified bronchus or lung: Secondary | ICD-10-CM | POA: Diagnosis not present

## 2016-04-30 DIAGNOSIS — F17210 Nicotine dependence, cigarettes, uncomplicated: Secondary | ICD-10-CM | POA: Diagnosis not present

## 2016-04-30 DIAGNOSIS — Z79899 Other long term (current) drug therapy: Secondary | ICD-10-CM | POA: Diagnosis not present

## 2016-04-30 HISTORY — PX: IR FLUORO GUIDE PORT INSERTION RIGHT: IMG5741

## 2016-04-30 MED ORDER — MIDAZOLAM HCL 2 MG/2ML IJ SOLN
INTRAMUSCULAR | Status: AC | PRN
Start: 2016-04-30 — End: 2016-04-30
  Administered 2016-04-30 (×3): 1 mg via INTRAVENOUS

## 2016-04-30 MED ORDER — FENTANYL CITRATE (PF) 100 MCG/2ML IJ SOLN
INTRAMUSCULAR | Status: AC | PRN
  Administered 2016-04-30 (×3): 50 ug via INTRAVENOUS

## 2016-04-30 MED ORDER — VANCOMYCIN HCL IN DEXTROSE 1-5 GM/200ML-% IV SOLN
1000.0000 mg | INTRAVENOUS | Status: AC
  Administered 2016-04-30: 1000 mg via INTRAVENOUS
  Filled 2016-04-30 (×2): qty 200

## 2016-04-30 MED ORDER — SODIUM CHLORIDE 0.9 % IV SOLN
INTRAVENOUS | Status: DC
  Administered 2016-04-30: 11:00:00 via INTRAVENOUS

## 2016-04-30 NOTE — Procedures (Signed)
Small cell lung carcinoma, stage IIIB.  Status post right IJ single lumen power portacatheter  Tip SVC/RA junction  No immediate complication  EBL less than 5 mL  Access ready for use  Full report in PACs
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
## 2016-04-30 NOTE — Discharge Instructions (Signed)
Implanted Port Insertion, Care After °This sheet gives you information about how to care for yourself after your procedure. Your health care provider may also give you more specific instructions. If you have problems or questions, contact your health care provider. °What can I expect after the procedure? °After your procedure, it is common to have: °· Discomfort at the port insertion site. °· Bruising on the skin over the port. This should improve over 3-4 days. ° °Follow these instructions at home: °Port care °· After your port is placed, you will get a manufacturer's information card. The card has information about your port. Keep this card with you at all times. °· Take care of the port as told by your health care provider. Ask your health care provider if you or a family member can get training for taking care of the port at home. A home health care nurse may also take care of the port. °· Make sure to remember what type of port you have. °Incision care °· Follow instructions from your health care provider about how to take care of your port insertion site. Make sure you: °? Wash your hands with soap and water before you change your bandage (dressing). If soap and water are not available, use hand sanitizer. °? Change your dressing as told by your health care provider. °? Leave stitches (sutures), skin glue, or adhesive strips in place. These skin closures may need to stay in place for 2 weeks or longer. If adhesive strip edges start to loosen and curl up, you may trim the loose edges. Do not remove adhesive strips completely unless your health care provider tells you to do that. °· Check your port insertion site every day for signs of infection. Check for: °? More redness, swelling, or pain. °? More fluid or blood. °? Warmth. °? Pus or a bad smell. °General instructions °· Do not take baths, swim, or use a hot tub until your health care provider approves. °· Do not lift anything that is heavier than 10 lb (4.5  kg) for a week, or as told by your health care provider. °· Ask your health care provider when it is okay to: °? Return to work or school. °? Resume usual physical activities or sports. °· Do not drive for 24 hours if you were given a medicine to help you relax (sedative). °· Take over-the-counter and prescription medicines only as told by your health care provider. °· Wear a medical alert bracelet in case of an emergency. This will tell any health care providers that you have a port. °· Keep all follow-up visits as told by your health care provider. This is important. °Contact a health care provider if: °· You cannot flush your port with saline as directed, or you cannot draw blood from the port. °· You have a fever or chills. °· You have more redness, swelling, or pain around your port insertion site. °· You have more fluid or blood coming from your port insertion site. °· Your port insertion site feels warm to the touch. °· You have pus or a bad smell coming from the port insertion site. °Get help right away if: °· You have chest pain or shortness of breath. °· You have bleeding from your port that you cannot control. °Summary °· Take care of the port as told by your health care provider. °· Change your dressing as told by your health care provider. °· Keep all follow-up visits as told by your health care provider. °  This information is not intended to replace advice given to you by your health care provider. Make sure you discuss any questions you have with your health care provider. °Document Released: 10/25/2012 Document Revised: 11/26/2015 Document Reviewed: 11/26/2015 °Elsevier Interactive Patient Education © 2017 [employer] ° °

## 2016-05-03 ENCOUNTER — Telehealth: Payer: Self-pay | Admitting: *Deleted

## 2016-05-03 DIAGNOSIS — Z510 Encounter for antineoplastic radiation therapy: Secondary | ICD-10-CM | POA: Diagnosis not present

## 2016-05-03 NOTE — Telephone Encounter (Signed)
Pt's husband, Jeneen Rinks, called to clarify appt times. All appts reviewed with pt's husband. Jeneen Rinks verbalized understanding.

## 2016-05-04 ENCOUNTER — Inpatient Hospital Stay (HOSPITAL_BASED_OUTPATIENT_CLINIC_OR_DEPARTMENT_OTHER): Payer: Medicaid Other | Admitting: Oncology

## 2016-05-04 ENCOUNTER — Inpatient Hospital Stay: Payer: Medicaid Other

## 2016-05-04 ENCOUNTER — Encounter: Payer: Self-pay | Admitting: Oncology

## 2016-05-04 VITALS — BP 104/58 | HR 84 | Temp 97.1°F | Resp 18 | Ht 63.0 in | Wt 137.9 lb

## 2016-05-04 VITALS — BP 100/59 | HR 72 | Resp 18

## 2016-05-04 DIAGNOSIS — C3412 Malignant neoplasm of upper lobe, left bronchus or lung: Secondary | ICD-10-CM | POA: Diagnosis not present

## 2016-05-04 DIAGNOSIS — R51 Headache: Secondary | ICD-10-CM

## 2016-05-04 DIAGNOSIS — R0789 Other chest pain: Secondary | ICD-10-CM

## 2016-05-04 DIAGNOSIS — F419 Anxiety disorder, unspecified: Secondary | ICD-10-CM

## 2016-05-04 DIAGNOSIS — Z5111 Encounter for antineoplastic chemotherapy: Secondary | ICD-10-CM | POA: Diagnosis not present

## 2016-05-04 DIAGNOSIS — F319 Bipolar disorder, unspecified: Secondary | ICD-10-CM

## 2016-05-04 DIAGNOSIS — R590 Localized enlarged lymph nodes: Secondary | ICD-10-CM

## 2016-05-04 DIAGNOSIS — Z79899 Other long term (current) drug therapy: Secondary | ICD-10-CM

## 2016-05-04 DIAGNOSIS — F418 Other specified anxiety disorders: Secondary | ICD-10-CM

## 2016-05-04 DIAGNOSIS — J069 Acute upper respiratory infection, unspecified: Secondary | ICD-10-CM | POA: Diagnosis not present

## 2016-05-04 DIAGNOSIS — C3492 Malignant neoplasm of unspecified part of left bronchus or lung: Secondary | ICD-10-CM

## 2016-05-04 DIAGNOSIS — F17210 Nicotine dependence, cigarettes, uncomplicated: Secondary | ICD-10-CM

## 2016-05-04 DIAGNOSIS — R079 Chest pain, unspecified: Secondary | ICD-10-CM

## 2016-05-04 DIAGNOSIS — E279 Disorder of adrenal gland, unspecified: Secondary | ICD-10-CM

## 2016-05-04 DIAGNOSIS — C3490 Malignant neoplasm of unspecified part of unspecified bronchus or lung: Secondary | ICD-10-CM

## 2016-05-04 DIAGNOSIS — R32 Unspecified urinary incontinence: Secondary | ICD-10-CM

## 2016-05-04 DIAGNOSIS — R05 Cough: Secondary | ICD-10-CM

## 2016-05-04 DIAGNOSIS — K219 Gastro-esophageal reflux disease without esophagitis: Secondary | ICD-10-CM

## 2016-05-04 DIAGNOSIS — G893 Neoplasm related pain (acute) (chronic): Secondary | ICD-10-CM

## 2016-05-04 DIAGNOSIS — R634 Abnormal weight loss: Secondary | ICD-10-CM

## 2016-05-04 DIAGNOSIS — Z7189 Other specified counseling: Secondary | ICD-10-CM | POA: Insufficient documentation

## 2016-05-04 LAB — CBC WITH DIFFERENTIAL/PLATELET
Basophils Absolute: 0.1 10*3/uL (ref 0–0.1)
Basophils Relative: 1 %
EOS PCT: 2 %
Eosinophils Absolute: 0.1 10*3/uL (ref 0–0.7)
HCT: 40.3 % (ref 35.0–47.0)
HEMOGLOBIN: 14.3 g/dL (ref 12.0–16.0)
Lymphocytes Relative: 34 %
Lymphs Abs: 2 10*3/uL (ref 1.0–3.6)
MCH: 33.5 pg (ref 26.0–34.0)
MCHC: 35.5 g/dL (ref 32.0–36.0)
MCV: 94.4 fL (ref 80.0–100.0)
Monocytes Absolute: 0.6 10*3/uL (ref 0.2–0.9)
Monocytes Relative: 10 %
Neutro Abs: 3.3 10*3/uL (ref 1.4–6.5)
Neutrophils Relative %: 53 %
PLATELETS: 309 10*3/uL (ref 150–440)
RBC: 4.27 MIL/uL (ref 3.80–5.20)
RDW: 13.4 % (ref 11.5–14.5)
WBC: 6.1 10*3/uL (ref 3.6–11.0)

## 2016-05-04 LAB — COMPREHENSIVE METABOLIC PANEL
ALK PHOS: 73 U/L (ref 38–126)
ALT: 35 U/L (ref 14–54)
AST: 25 U/L (ref 15–41)
Albumin: 4.1 g/dL (ref 3.5–5.0)
Anion gap: 5 (ref 5–15)
BUN: 17 mg/dL (ref 6–20)
CALCIUM: 8.8 mg/dL — AB (ref 8.9–10.3)
CO2: 26 mmol/L (ref 22–32)
CREATININE: 0.79 mg/dL (ref 0.44–1.00)
Chloride: 104 mmol/L (ref 101–111)
GFR calc non Af Amer: >60 mL/min (ref >60)
Glucose, Bld: 95 mg/dL (ref 65–99)
Potassium: 3.5 mmol/L (ref 3.5–5.1)
Sodium: 135 mmol/L (ref 135–145)
TOTAL PROTEIN: 7.5 g/dL (ref 6.5–8.1)
Total Bilirubin: 0.4 mg/dL (ref 0.3–1.2)

## 2016-05-04 LAB — URIC ACID: URIC ACID, SERUM: 4.2 mg/dL (ref 2.3–6.6)

## 2016-05-04 LAB — LACTATE DEHYDROGENASE: LDH: 193 U/L — AB (ref 98–192)

## 2016-05-04 MED ORDER — HEPARIN SOD (PORK) LOCK FLUSH 100 UNIT/ML IV SOLN: 500.0000 [IU] | Freq: Once | INTRAVENOUS | Status: DC | PRN

## 2016-05-04 MED ORDER — BENZONATATE 200 MG PO CAPS: 200.0000 mg | ORAL_CAPSULE | Freq: Three times a day (TID) | ORAL | 0 refills | Status: AC | PRN

## 2016-05-04 MED ORDER — SODIUM CHLORIDE 0.9 % IV SOLN
100.0000 mg/m2 | Freq: Once | INTRAVENOUS | Status: AC
  Administered 2016-05-04: 170 mg via INTRAVENOUS
  Filled 2016-05-04: qty 8.5

## 2016-05-04 MED ORDER — PALONOSETRON HCL INJECTION 0.25 MG/5ML
0.2500 mg | Freq: Once | INTRAVENOUS | Status: AC
  Administered 2016-05-04: 0.25 mg via INTRAVENOUS
  Filled 2016-05-04: qty 5

## 2016-05-04 MED ORDER — HEPARIN SOD (PORK) LOCK FLUSH 100 UNIT/ML IV SOLN
500.0000 [IU] | Freq: Once | INTRAVENOUS | Status: AC
  Administered 2016-05-04: 500 [IU] via INTRAVENOUS

## 2016-05-04 MED ORDER — LEVOFLOXACIN 500 MG PO TABS: 500.0000 mg | ORAL_TABLET | Freq: Every day | ORAL | 0 refills | Status: DC

## 2016-05-04 MED ORDER — SODIUM CHLORIDE 0.9% FLUSH
10.0000 mL | INTRAVENOUS | Status: DC | PRN
  Administered 2016-05-04: 10 mL
  Filled 2016-05-04: qty 10

## 2016-05-04 MED ORDER — CISPLATIN CHEMO INJECTION 100MG/100ML
80.0000 mg/m2 | Freq: Once | INTRAVENOUS | Status: AC
  Administered 2016-05-04: 135 mg via INTRAVENOUS
  Filled 2016-05-04: qty 100

## 2016-05-04 MED ORDER — POTASSIUM CHLORIDE 2 MEQ/ML IV SOLN
Freq: Once | INTRAVENOUS | Status: AC
  Administered 2016-05-04: 10:00:00 via INTRAVENOUS
  Filled 2016-05-04: qty 1000

## 2016-05-04 MED ORDER — SODIUM CHLORIDE 0.9 % IV SOLN
Freq: Once | INTRAVENOUS | Status: AC
  Administered 2016-05-04: 12:00:00 via INTRAVENOUS
  Filled 2016-05-04: qty 5

## 2016-05-04 MED ORDER — SODIUM CHLORIDE 0.9% FLUSH
10.0000 mL | Freq: Once | INTRAVENOUS | Status: AC
  Administered 2016-05-04: 10 mL via INTRAVENOUS
  Filled 2016-05-04: qty 10

## 2016-05-04 MED ORDER — SODIUM CHLORIDE 0.9 % IV SOLN
Freq: Once | INTRAVENOUS | Status: AC
  Administered 2016-05-04: 09:00:00 via INTRAVENOUS
  Filled 2016-05-04: qty 1000

## 2016-05-04 NOTE — Progress Notes (Signed)
Patient states, "My stomach is burning." Patient just finished eating lunch. Vital signs stable, see flow sheet. MD, Dr. Janese Banks, notified via telephone. Per MD order: proceed with treatment at this time. Continue to monitor patient for any further signs/symptoms.
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
## 2016-05-05 ENCOUNTER — Inpatient Hospital Stay: Payer: Medicaid Other

## 2016-05-05 VITALS — BP 117/83 | HR 71 | Temp 97.8°F | Resp 18

## 2016-05-05 DIAGNOSIS — Z5111 Encounter for antineoplastic chemotherapy: Secondary | ICD-10-CM | POA: Diagnosis not present

## 2016-05-05 DIAGNOSIS — C3490 Malignant neoplasm of unspecified part of unspecified bronchus or lung: Secondary | ICD-10-CM

## 2016-05-05 MED ORDER — DEXAMETHASONE SODIUM PHOSPHATE 10 MG/ML IJ SOLN
10.0000 mg | Freq: Once | INTRAMUSCULAR | Status: AC
  Administered 2016-05-05: 10 mg via INTRAVENOUS
  Filled 2016-05-05: qty 1

## 2016-05-05 MED ORDER — SODIUM CHLORIDE 0.9 % IV SOLN
Freq: Once | INTRAVENOUS | Status: AC
  Administered 2016-05-05: 10:00:00 via INTRAVENOUS
  Filled 2016-05-05: qty 1000

## 2016-05-05 MED ORDER — HEPARIN SOD (PORK) LOCK FLUSH 100 UNIT/ML IV SOLN
500.0000 [IU] | Freq: Once | INTRAVENOUS | Status: AC
  Administered 2016-05-05: 500 [IU] via INTRAVENOUS

## 2016-05-05 MED ORDER — SODIUM CHLORIDE 0.9 % IV SOLN
100.0000 mg/m2 | Freq: Once | INTRAVENOUS | Status: AC
  Administered 2016-05-05: 170 mg via INTRAVENOUS
  Filled 2016-05-05 (×2): qty 8.5

## 2016-05-06 ENCOUNTER — Other Ambulatory Visit: Payer: Self-pay

## 2016-05-06 ENCOUNTER — Inpatient Hospital Stay: Payer: Medicaid Other

## 2016-05-06 ENCOUNTER — Ambulatory Visit
Admission: RE | Admit: 2016-05-06 | Discharge: 2016-05-06 | Disposition: A | Discharge status: Home / Self Care | Payer: Medicaid Other | Source: Ambulatory Visit | Attending: Radiation Oncology | Admitting: Radiation Oncology

## 2016-05-06 ENCOUNTER — Other Ambulatory Visit: Payer: Self-pay | Admitting: *Deleted

## 2016-05-06 VITALS — BP 126/90 | HR 64 | Temp 97.0°F | Resp 24

## 2016-05-06 DIAGNOSIS — C3490 Malignant neoplasm of unspecified part of unspecified bronchus or lung: Secondary | ICD-10-CM

## 2016-05-06 DIAGNOSIS — Z5111 Encounter for antineoplastic chemotherapy: Secondary | ICD-10-CM | POA: Diagnosis not present

## 2016-05-06 DIAGNOSIS — R079 Chest pain, unspecified: Secondary | ICD-10-CM

## 2016-05-06 MED ORDER — PROCHLORPERAZINE EDISYLATE 5 MG/ML IJ SOLN
10.0000 mg | Freq: Once | INTRAMUSCULAR | Status: AC
  Administered 2016-05-06: 10 mg via INTRAVENOUS
  Filled 2016-05-06: qty 2

## 2016-05-06 MED ORDER — MORPHINE SULFATE 2 MG/ML IJ SOLN
2.0000 mg | Freq: Once | INTRAMUSCULAR | Status: AC
  Administered 2016-05-06: 2 mg via INTRAVENOUS
  Filled 2016-05-06: qty 1

## 2016-05-06 MED ORDER — SODIUM CHLORIDE 0.9% FLUSH
10.0000 mL | INTRAVENOUS | Status: DC | PRN
  Administered 2016-05-06: 10 mL
  Filled 2016-05-06: qty 10

## 2016-05-06 MED ORDER — SODIUM CHLORIDE 0.9 % IV SOLN
100.0000 mg/m2 | Freq: Once | INTRAVENOUS | Status: AC
  Administered 2016-05-06: 170 mg via INTRAVENOUS
  Filled 2016-05-06: qty 8.5

## 2016-05-06 MED ORDER — HEPARIN SOD (PORK) LOCK FLUSH 100 UNIT/ML IV SOLN
500.0000 [IU] | Freq: Once | INTRAVENOUS | Status: AC | PRN
  Administered 2016-05-06: 500 [IU]
  Filled 2016-05-06: qty 5

## 2016-05-06 MED ORDER — SODIUM CHLORIDE 0.9 % IV SOLN
Freq: Once | INTRAVENOUS | Status: AC
  Administered 2016-05-06: 11:00:00 via INTRAVENOUS
  Filled 2016-05-06: qty 1000

## 2016-05-06 MED ORDER — DEXAMETHASONE SODIUM PHOSPHATE 10 MG/ML IJ SOLN
10.0000 mg | Freq: Once | INTRAMUSCULAR | Status: AC
  Administered 2016-05-06: 10 mg via INTRAVENOUS
  Filled 2016-05-06: qty 1

## 2016-05-06 MED ORDER — SODIUM CHLORIDE 0.9 % IV SOLN
Freq: Once | INTRAVENOUS | Status: AC
  Administered 2016-05-06: 10:00:00 via INTRAVENOUS
  Filled 2016-05-06: qty 1000

## 2016-05-06 NOTE — Progress Notes (Signed)
1202-Pt states pain is gone, EKG normal. VP16 restarted. Tolerated well. No further complaints.

## 2016-05-06 NOTE — Progress Notes (Signed)
Pt states she can't keep anything down for past two days. Very nauseated. Per Dr Janese Banks give 1L NS bolus and '10mg'$  IV compazine  1127-Pt c/o chest pain. 7/10 sharp in middle of chest. VP16 stopped, Dr Janese Banks at chairside. EKG ordered, '2mg'$  Morphine ordered IV. Pt told depending on EKG results and if pain does not subside may have to go to ED. Awaiting EKG. Morphine given at 1127. 1141 pt turned on side resting comfortably in chair. Will continue to monitor.

## 2016-05-10 ENCOUNTER — Telehealth: Payer: Self-pay | Admitting: *Deleted

## 2016-05-10 ENCOUNTER — Ambulatory Visit
Admission: RE | Admit: 2016-05-10 | Discharge: 2016-05-10 | Disposition: A | Discharge status: Home / Self Care | Payer: Medicaid Other | Source: Ambulatory Visit | Attending: Radiation Oncology | Admitting: Radiation Oncology

## 2016-05-10 ENCOUNTER — Inpatient Hospital Stay: Payer: Medicaid Other

## 2016-05-10 DIAGNOSIS — Z510 Encounter for antineoplastic radiation therapy: Secondary | ICD-10-CM | POA: Diagnosis not present

## 2016-05-10 NOTE — Telephone Encounter (Signed)
Can you make this referal asap? Thanks

## 2016-05-10 NOTE — Telephone Encounter (Signed)
Pt's husband, Jeneen Rinks, left message that has completed evaluations for psychiatry services at [facility]. Their facility will not be able to assist patient with medications and psychiatric treatment until end of June per pt's husband. The staff at [facility] recommended that the cancer center refer the patient to a psychiatrist within [facility] for quicker management of the pt's psychiatric needs. Pt would like a call back to discuss further.

## 2016-05-11 ENCOUNTER — Inpatient Hospital Stay: Payer: Medicaid Other

## 2016-05-11 ENCOUNTER — Ambulatory Visit
Admission: RE | Admit: 2016-05-11 | Discharge: 2016-05-11 | Disposition: A | Discharge status: Home / Self Care | Payer: Medicaid Other | Source: Ambulatory Visit | Attending: Radiation Oncology | Admitting: Radiation Oncology

## 2016-05-11 DIAGNOSIS — Z510 Encounter for antineoplastic radiation therapy: Secondary | ICD-10-CM | POA: Diagnosis not present

## 2016-05-12 ENCOUNTER — Inpatient Hospital Stay: Payer: Medicaid Other

## 2016-05-12 ENCOUNTER — Ambulatory Visit
Admission: RE | Admit: 2016-05-12 | Discharge: 2016-05-12 | Disposition: A | Discharge status: Home / Self Care | Payer: Medicaid Other | Source: Ambulatory Visit | Attending: Radiation Oncology | Admitting: Radiation Oncology

## 2016-05-12 DIAGNOSIS — Z510 Encounter for antineoplastic radiation therapy: Secondary | ICD-10-CM | POA: Diagnosis not present

## 2016-05-13 ENCOUNTER — Ambulatory Visit
Admission: RE | Admit: 2016-05-13 | Discharge: 2016-05-13 | Disposition: A | Discharge status: Home / Self Care | Payer: Medicaid Other | Source: Ambulatory Visit | Attending: Radiation Oncology | Admitting: Radiation Oncology

## 2016-05-13 ENCOUNTER — Inpatient Hospital Stay: Payer: Medicaid Other

## 2016-05-13 ENCOUNTER — Telehealth: Payer: Self-pay

## 2016-05-13 DIAGNOSIS — Z510 Encounter for antineoplastic radiation therapy: Secondary | ICD-10-CM | POA: Diagnosis not present

## 2016-05-13 NOTE — Telephone Encounter (Signed)
Call to pt husband Jasmine Young who has asked Dr Elroy Channel team for assistance w psychiatric  treatment for wife Jasmine Young. Mr. Otoole was given info by this Probation officer that [employer] is available for assessment. He was informed that [employer] @ [place]phone [phone]  -informed  This is a 9a-4a walk in clinic  He ackowleged understanding then stated " I cant take her now she is sick w chemo and radiation treatments " support provided. Cory Munch

## 2016-05-13 NOTE — Telephone Encounter (Signed)
There is a telephone notes in the computer. Where the pt. Went for eval they do not have a prescribing doctor in yet to give meds and it will be end of June before there is a doctor there.  The place pt went to rec: that pt go to a [place] [facility]. Gerald Stabs called around to different offices and no one was taking medicaid. She called down to psych unit and explained to them the situation and insurance of the patient and was given [facility] and chris checked it out and got all details and called husband about it. See note for further details

## 2016-05-14 ENCOUNTER — Other Ambulatory Visit: Payer: Self-pay | Admitting: *Deleted

## 2016-05-14 ENCOUNTER — Encounter: Payer: Self-pay | Admitting: Oncology

## 2016-05-14 ENCOUNTER — Inpatient Hospital Stay (HOSPITAL_BASED_OUTPATIENT_CLINIC_OR_DEPARTMENT_OTHER): Payer: Medicaid Other | Admitting: Oncology

## 2016-05-14 ENCOUNTER — Ambulatory Visit
Admission: RE | Admit: 2016-05-14 | Discharge: 2016-05-14 | Disposition: A | Discharge status: Home / Self Care | Payer: Medicaid Other | Source: Ambulatory Visit | Attending: Radiation Oncology | Admitting: Radiation Oncology

## 2016-05-14 ENCOUNTER — Inpatient Hospital Stay: Payer: Medicaid Other

## 2016-05-14 ENCOUNTER — Telehealth: Payer: Self-pay | Admitting: *Deleted

## 2016-05-14 VITALS — BP 108/69 | HR 88 | Temp 97.5°F | Resp 18 | Wt 136.7 lb

## 2016-05-14 DIAGNOSIS — R590 Localized enlarged lymph nodes: Secondary | ICD-10-CM

## 2016-05-14 DIAGNOSIS — R0789 Other chest pain: Secondary | ICD-10-CM

## 2016-05-14 DIAGNOSIS — J069 Acute upper respiratory infection, unspecified: Secondary | ICD-10-CM

## 2016-05-14 DIAGNOSIS — R634 Abnormal weight loss: Secondary | ICD-10-CM

## 2016-05-14 DIAGNOSIS — R05 Cough: Secondary | ICD-10-CM

## 2016-05-14 DIAGNOSIS — R079 Chest pain, unspecified: Secondary | ICD-10-CM

## 2016-05-14 DIAGNOSIS — E279 Disorder of adrenal gland, unspecified: Secondary | ICD-10-CM

## 2016-05-14 DIAGNOSIS — F418 Other specified anxiety disorders: Secondary | ICD-10-CM | POA: Diagnosis not present

## 2016-05-14 DIAGNOSIS — Z79899 Other long term (current) drug therapy: Secondary | ICD-10-CM

## 2016-05-14 DIAGNOSIS — C3412 Malignant neoplasm of upper lobe, left bronchus or lung: Secondary | ICD-10-CM | POA: Diagnosis not present

## 2016-05-14 DIAGNOSIS — C3490 Malignant neoplasm of unspecified part of unspecified bronchus or lung: Secondary | ICD-10-CM

## 2016-05-14 DIAGNOSIS — K219 Gastro-esophageal reflux disease without esophagitis: Secondary | ICD-10-CM | POA: Diagnosis not present

## 2016-05-14 DIAGNOSIS — R51 Headache: Secondary | ICD-10-CM

## 2016-05-14 DIAGNOSIS — F17210 Nicotine dependence, cigarettes, uncomplicated: Secondary | ICD-10-CM

## 2016-05-14 DIAGNOSIS — Z5111 Encounter for antineoplastic chemotherapy: Secondary | ICD-10-CM | POA: Diagnosis not present

## 2016-05-14 DIAGNOSIS — Z510 Encounter for antineoplastic radiation therapy: Secondary | ICD-10-CM | POA: Diagnosis not present

## 2016-05-14 DIAGNOSIS — R32 Unspecified urinary incontinence: Secondary | ICD-10-CM

## 2016-05-14 DIAGNOSIS — F319 Bipolar disorder, unspecified: Secondary | ICD-10-CM

## 2016-05-14 LAB — CBC WITH DIFFERENTIAL/PLATELET
Basophils Absolute: 0 10*3/uL (ref 0–0.1)
Basophils Relative: 1 %
Eosinophils Absolute: 0 10*3/uL (ref 0–0.7)
Eosinophils Relative: 1 %
HEMATOCRIT: 35.9 % (ref 35.0–47.0)
Hemoglobin: 12.8 g/dL (ref 12.0–16.0)
LYMPHS PCT: 22 %
Lymphs Abs: 0.8 10*3/uL — ABNORMAL LOW (ref 1.0–3.6)
MCH: 33.3 pg (ref 26.0–34.0)
MCHC: 35.5 g/dL (ref 32.0–36.0)
MCV: 93.7 fL (ref 80.0–100.0)
MONO ABS: 0.2 10*3/uL (ref 0.2–0.9)
MONOS PCT: 6 %
NEUTROS ABS: 2.7 10*3/uL (ref 1.4–6.5)
Neutrophils Relative %: 70 %
Platelets: 199 10*3/uL (ref 150–440)
RBC: 3.83 MIL/uL (ref 3.80–5.20)
RDW: 13 % (ref 11.5–14.5)
WBC: 3.9 10*3/uL (ref 3.6–11.0)

## 2016-05-14 LAB — COMPREHENSIVE METABOLIC PANEL
ALK PHOS: 54 U/L (ref 38–126)
ALT: 13 U/L — ABNORMAL LOW (ref 14–54)
ANION GAP: 4 — AB (ref 5–15)
AST: 18 U/L (ref 15–41)
Albumin: 3.9 g/dL (ref 3.5–5.0)
BILIRUBIN TOTAL: 0.5 mg/dL (ref 0.3–1.2)
BUN: 18 mg/dL (ref 6–20)
CALCIUM: 9.1 mg/dL (ref 8.9–10.3)
CO2: 25 mmol/L (ref 22–32)
Chloride: 107 mmol/L (ref 101–111)
Creatinine, Ser: 0.77 mg/dL (ref 0.44–1.00)
GFR calc Af Amer: >60 mL/min (ref >60)
Glucose, Bld: 77 mg/dL (ref 65–99)
POTASSIUM: 3.3 mmol/L — AB (ref 3.5–5.1)
Sodium: 136 mmol/L (ref 135–145)
TOTAL PROTEIN: 7.2 g/dL (ref 6.5–8.1)

## 2016-05-14 MED ORDER — POTASSIUM CHLORIDE CRYS ER 20 MEQ PO TBCR: 20.0000 meq | EXTENDED_RELEASE_TABLET | Freq: Every day | ORAL | 0 refills | Status: DC
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
## 2016-05-14 NOTE — Telephone Encounter (Signed)
Called to let husband know that her potsssium low and that I sent in rx to [facility] for one a day for 7 days. He will get it

## 2016-05-14 NOTE — Progress Notes (Signed)
For follow up today. c/o nausea and vomited 3 x this am. Taking meds to help. Med reconciliation done-pt stated does not know her meds-husband usually helps her but"no changes to med list"

## 2016-05-17 ENCOUNTER — Inpatient Hospital Stay: Payer: Medicaid Other

## 2016-05-17 ENCOUNTER — Ambulatory Visit
Admission: RE | Admit: 2016-05-17 | Discharge: 2016-05-17 | Disposition: A | Discharge status: Home / Self Care | Payer: Medicaid Other | Source: Ambulatory Visit | Attending: Radiation Oncology | Admitting: Radiation Oncology

## 2016-05-17 DIAGNOSIS — Z510 Encounter for antineoplastic radiation therapy: Secondary | ICD-10-CM | POA: Diagnosis not present

## 2016-05-18 ENCOUNTER — Inpatient Hospital Stay: Payer: Medicaid Other | Attending: Radiation Oncology

## 2016-05-18 ENCOUNTER — Ambulatory Visit
Admission: RE | Admit: 2016-05-18 | Discharge: 2016-05-18 | Disposition: A | Discharge status: Home / Self Care | Payer: Medicaid Other | Source: Ambulatory Visit | Attending: Radiation Oncology | Admitting: Radiation Oncology

## 2016-05-18 DIAGNOSIS — D72819 Decreased white blood cell count, unspecified: Secondary | ICD-10-CM | POA: Insufficient documentation

## 2016-05-18 DIAGNOSIS — G43909 Migraine, unspecified, not intractable, without status migrainosus: Secondary | ICD-10-CM | POA: Insufficient documentation

## 2016-05-18 DIAGNOSIS — F17210 Nicotine dependence, cigarettes, uncomplicated: Secondary | ICD-10-CM | POA: Insufficient documentation

## 2016-05-18 DIAGNOSIS — F418 Other specified anxiety disorders: Secondary | ICD-10-CM | POA: Insufficient documentation

## 2016-05-18 DIAGNOSIS — C3411 Malignant neoplasm of upper lobe, right bronchus or lung: Secondary | ICD-10-CM | POA: Insufficient documentation

## 2016-05-18 DIAGNOSIS — Z9181 History of falling: Secondary | ICD-10-CM | POA: Insufficient documentation

## 2016-05-18 DIAGNOSIS — G893 Neoplasm related pain (acute) (chronic): Secondary | ICD-10-CM | POA: Insufficient documentation

## 2016-05-18 DIAGNOSIS — F319 Bipolar disorder, unspecified: Secondary | ICD-10-CM | POA: Insufficient documentation

## 2016-05-18 DIAGNOSIS — R1310 Dysphagia, unspecified: Secondary | ICD-10-CM | POA: Insufficient documentation

## 2016-05-18 DIAGNOSIS — D709 Neutropenia, unspecified: Secondary | ICD-10-CM | POA: Insufficient documentation

## 2016-05-18 DIAGNOSIS — Z8541 Personal history of malignant neoplasm of cervix uteri: Secondary | ICD-10-CM | POA: Insufficient documentation

## 2016-05-18 DIAGNOSIS — Z79899 Other long term (current) drug therapy: Secondary | ICD-10-CM | POA: Insufficient documentation

## 2016-05-18 DIAGNOSIS — E279 Disorder of adrenal gland, unspecified: Secondary | ICD-10-CM | POA: Insufficient documentation

## 2016-05-18 DIAGNOSIS — R079 Chest pain, unspecified: Secondary | ICD-10-CM | POA: Insufficient documentation

## 2016-05-18 DIAGNOSIS — Z5111 Encounter for antineoplastic chemotherapy: Secondary | ICD-10-CM | POA: Insufficient documentation

## 2016-05-18 DIAGNOSIS — Z510 Encounter for antineoplastic radiation therapy: Secondary | ICD-10-CM | POA: Diagnosis not present

## 2016-05-18 DIAGNOSIS — R590 Localized enlarged lymph nodes: Secondary | ICD-10-CM | POA: Insufficient documentation

## 2016-05-18 DIAGNOSIS — R112 Nausea with vomiting, unspecified: Secondary | ICD-10-CM | POA: Insufficient documentation

## 2016-05-18 DIAGNOSIS — K208 Other esophagitis: Secondary | ICD-10-CM | POA: Insufficient documentation

## 2016-05-18 DIAGNOSIS — R12 Heartburn: Secondary | ICD-10-CM | POA: Insufficient documentation

## 2016-05-18 DIAGNOSIS — R634 Abnormal weight loss: Secondary | ICD-10-CM | POA: Insufficient documentation

## 2016-05-18 DIAGNOSIS — G40909 Epilepsy, unspecified, not intractable, without status epilepticus: Secondary | ICD-10-CM | POA: Insufficient documentation

## 2016-05-19 ENCOUNTER — Ambulatory Visit: Payer: Medicaid Other

## 2016-05-19 ENCOUNTER — Telehealth: Payer: Self-pay | Admitting: *Deleted

## 2016-05-19 ENCOUNTER — Inpatient Hospital Stay: Payer: Medicaid Other

## 2016-05-19 NOTE — Telephone Encounter (Signed)
Pt's spouse called and asked for a return call back. Called pt's spouse, Jeneen Rinks, back twice without answer and unable to leave message.

## 2016-05-20 ENCOUNTER — Ambulatory Visit
Admission: RE | Admit: 2016-05-20 | Discharge: 2016-05-20 | Disposition: A | Discharge status: Home / Self Care | Payer: Medicaid Other | Source: Ambulatory Visit | Attending: Radiation Oncology | Admitting: Radiation Oncology

## 2016-05-20 ENCOUNTER — Inpatient Hospital Stay: Payer: Medicaid Other

## 2016-05-20 DIAGNOSIS — Z510 Encounter for antineoplastic radiation therapy: Secondary | ICD-10-CM | POA: Diagnosis not present

## 2016-05-21 ENCOUNTER — Ambulatory Visit: Payer: Medicaid Other

## 2016-05-21 ENCOUNTER — Inpatient Hospital Stay: Payer: Medicaid Other

## 2016-05-24 ENCOUNTER — Other Ambulatory Visit: Payer: Self-pay | Admitting: *Deleted

## 2016-05-24 ENCOUNTER — Inpatient Hospital Stay: Payer: Medicaid Other

## 2016-05-24 ENCOUNTER — Ambulatory Visit
Admission: RE | Admit: 2016-05-24 | Discharge: 2016-05-24 | Disposition: A | Discharge status: Home / Self Care | Payer: Medicaid Other | Source: Ambulatory Visit | Attending: Radiation Oncology | Admitting: Radiation Oncology

## 2016-05-24 DIAGNOSIS — Z510 Encounter for antineoplastic radiation therapy: Secondary | ICD-10-CM | POA: Diagnosis not present

## 2016-05-24 DIAGNOSIS — C3490 Malignant neoplasm of unspecified part of unspecified bronchus or lung: Secondary | ICD-10-CM

## 2016-05-25 ENCOUNTER — Other Ambulatory Visit: Payer: Self-pay | Admitting: *Deleted

## 2016-05-25 ENCOUNTER — Inpatient Hospital Stay (HOSPITAL_BASED_OUTPATIENT_CLINIC_OR_DEPARTMENT_OTHER): Payer: Medicaid Other | Admitting: Oncology

## 2016-05-25 ENCOUNTER — Inpatient Hospital Stay: Payer: Medicaid Other

## 2016-05-25 ENCOUNTER — Encounter: Payer: Self-pay | Admitting: Oncology

## 2016-05-25 ENCOUNTER — Ambulatory Visit
Admission: RE | Admit: 2016-05-25 | Discharge: 2016-05-25 | Disposition: A | Discharge status: Home / Self Care | Payer: Medicaid Other | Source: Ambulatory Visit | Attending: Radiation Oncology | Admitting: Radiation Oncology

## 2016-05-25 VITALS — BP 98/66 | HR 72 | Temp 97.6°F | Resp 18 | Ht 63.0 in | Wt 135.1 lb

## 2016-05-25 DIAGNOSIS — Z5111 Encounter for antineoplastic chemotherapy: Secondary | ICD-10-CM | POA: Diagnosis not present

## 2016-05-25 DIAGNOSIS — G40909 Epilepsy, unspecified, not intractable, without status epilepticus: Secondary | ICD-10-CM

## 2016-05-25 DIAGNOSIS — F17210 Nicotine dependence, cigarettes, uncomplicated: Secondary | ICD-10-CM

## 2016-05-25 DIAGNOSIS — D701 Agranulocytosis secondary to cancer chemotherapy: Secondary | ICD-10-CM

## 2016-05-25 DIAGNOSIS — C3411 Malignant neoplasm of upper lobe, right bronchus or lung: Secondary | ICD-10-CM | POA: Diagnosis not present

## 2016-05-25 DIAGNOSIS — D709 Neutropenia, unspecified: Secondary | ICD-10-CM | POA: Diagnosis not present

## 2016-05-25 DIAGNOSIS — F418 Other specified anxiety disorders: Secondary | ICD-10-CM

## 2016-05-25 DIAGNOSIS — R634 Abnormal weight loss: Secondary | ICD-10-CM | POA: Diagnosis not present

## 2016-05-25 DIAGNOSIS — D72819 Decreased white blood cell count, unspecified: Secondary | ICD-10-CM | POA: Diagnosis not present

## 2016-05-25 DIAGNOSIS — Z9181 History of falling: Secondary | ICD-10-CM

## 2016-05-25 DIAGNOSIS — R12 Heartburn: Secondary | ICD-10-CM | POA: Diagnosis not present

## 2016-05-25 DIAGNOSIS — Z79899 Other long term (current) drug therapy: Secondary | ICD-10-CM

## 2016-05-25 DIAGNOSIS — Z510 Encounter for antineoplastic radiation therapy: Secondary | ICD-10-CM | POA: Diagnosis not present

## 2016-05-25 DIAGNOSIS — Z8541 Personal history of malignant neoplasm of cervix uteri: Secondary | ICD-10-CM | POA: Diagnosis not present

## 2016-05-25 DIAGNOSIS — C3492 Malignant neoplasm of unspecified part of left bronchus or lung: Secondary | ICD-10-CM

## 2016-05-25 DIAGNOSIS — G43909 Migraine, unspecified, not intractable, without status migrainosus: Secondary | ICD-10-CM

## 2016-05-25 DIAGNOSIS — R1310 Dysphagia, unspecified: Secondary | ICD-10-CM | POA: Diagnosis not present

## 2016-05-25 DIAGNOSIS — E279 Disorder of adrenal gland, unspecified: Secondary | ICD-10-CM | POA: Diagnosis not present

## 2016-05-25 DIAGNOSIS — K208 Other esophagitis: Secondary | ICD-10-CM | POA: Diagnosis not present

## 2016-05-25 DIAGNOSIS — F319 Bipolar disorder, unspecified: Secondary | ICD-10-CM | POA: Diagnosis not present

## 2016-05-25 DIAGNOSIS — R112 Nausea with vomiting, unspecified: Secondary | ICD-10-CM | POA: Diagnosis not present

## 2016-05-25 DIAGNOSIS — C3490 Malignant neoplasm of unspecified part of unspecified bronchus or lung: Secondary | ICD-10-CM

## 2016-05-25 DIAGNOSIS — R590 Localized enlarged lymph nodes: Secondary | ICD-10-CM | POA: Diagnosis not present

## 2016-05-25 DIAGNOSIS — T451X5A Adverse effect of antineoplastic and immunosuppressive drugs, initial encounter: Principal | ICD-10-CM

## 2016-05-25 DIAGNOSIS — Z95828 Presence of other vascular implants and grafts: Secondary | ICD-10-CM

## 2016-05-25 DIAGNOSIS — G893 Neoplasm related pain (acute) (chronic): Secondary | ICD-10-CM

## 2016-05-25 DIAGNOSIS — R079 Chest pain, unspecified: Secondary | ICD-10-CM | POA: Diagnosis not present

## 2016-05-25 LAB — COMPREHENSIVE METABOLIC PANEL
ALBUMIN: 3.8 g/dL (ref 3.5–5.0)
ALK PHOS: 53 U/L (ref 38–126)
ALT: 13 U/L — ABNORMAL LOW (ref 14–54)
AST: 17 U/L (ref 15–41)
Anion gap: 4 — ABNORMAL LOW (ref 5–15)
BILIRUBIN TOTAL: 0.4 mg/dL (ref 0.3–1.2)
BUN: 13 mg/dL (ref 6–20)
CALCIUM: 8.7 mg/dL — AB (ref 8.9–10.3)
CO2: 26 mmol/L (ref 22–32)
CREATININE: 0.84 mg/dL (ref 0.44–1.00)
Chloride: 105 mmol/L (ref 101–111)
GFR calc Af Amer: >60 mL/min (ref >60)
GFR calc non Af Amer: >60 mL/min (ref >60)
GLUCOSE: 92 mg/dL (ref 65–99)
Potassium: 3.6 mmol/L (ref 3.5–5.1)
Sodium: 135 mmol/L (ref 135–145)
TOTAL PROTEIN: 7.1 g/dL (ref 6.5–8.1)

## 2016-05-25 LAB — CBC WITH DIFFERENTIAL/PLATELET
BASOS PCT: 1 %
Basophils Absolute: 0 10*3/uL (ref 0–0.1)
Eosinophils Absolute: 0 10*3/uL (ref 0–0.7)
Eosinophils Relative: 1 %
HEMATOCRIT: 35.4 % (ref 35.0–47.0)
HEMOGLOBIN: 12.7 g/dL (ref 12.0–16.0)
Lymphocytes Relative: 34 %
Lymphs Abs: 0.7 10*3/uL — ABNORMAL LOW (ref 1.0–3.6)
MCH: 33.8 pg (ref 26.0–34.0)
MCHC: 35.9 g/dL (ref 32.0–36.0)
MCV: 94.4 fL (ref 80.0–100.0)
MONOS PCT: 27 %
Monocytes Absolute: 0.6 10*3/uL (ref 0.2–0.9)
NEUTROS ABS: 0.7 10*3/uL — AB (ref 1.4–6.5)
NEUTROS PCT: 37 %
Platelets: 253 10*3/uL (ref 150–440)
RBC: 3.75 MIL/uL — ABNORMAL LOW (ref 3.80–5.20)
RDW: 13.7 % (ref 11.5–14.5)
WBC: 2 10*3/uL — ABNORMAL LOW (ref 3.6–11.0)

## 2016-05-25 LAB — MAGNESIUM: Magnesium: 2.2 mg/dL (ref 1.7–2.4)

## 2016-05-25 MED ORDER — PANTOPRAZOLE SODIUM 20 MG PO TBEC: 20.0000 mg | DELAYED_RELEASE_TABLET | Freq: Every day | ORAL | 3 refills | Status: AC

## 2016-05-25 MED ORDER — LORAZEPAM 0.5 MG PO TABS: 0.5000 mg | ORAL_TABLET | Freq: Four times a day (QID) | ORAL | 0 refills | Status: DC | PRN

## 2016-05-25 MED ORDER — PROCHLORPERAZINE MALEATE 10 MG PO TABS
10.0000 mg | ORAL_TABLET | Freq: Four times a day (QID) | ORAL | 1 refills | Status: DC | PRN
Start: 2016-05-25 — End: 2016-09-14

## 2016-05-25 MED ORDER — HEPARIN SOD (PORK) LOCK FLUSH 100 UNIT/ML IV SOLN
500.0000 [IU] | Freq: Once | INTRAVENOUS | Status: AC
  Administered 2016-05-25: 500 [IU] via INTRAVENOUS

## 2016-05-25 MED ORDER — HEPARIN SOD (PORK) LOCK FLUSH 100 UNIT/ML IV SOLN
INTRAVENOUS | Status: AC
  Filled 2016-05-25: qty 5

## 2016-05-25 MED ORDER — OXYCODONE HCL 5 MG PO TABS: 5.0000 mg | ORAL_TABLET | ORAL | 0 refills | Status: DC | PRN

## 2016-05-25 MED ORDER — SUCRALFATE 1 G PO TABS: 1.0000 g | ORAL_TABLET | Freq: Three times a day (TID) | ORAL | 3 refills | Status: AC

## 2016-05-25 MED ORDER — ONDANSETRON HCL 8 MG PO TABS: 8.0000 mg | ORAL_TABLET | Freq: Two times a day (BID) | ORAL | 1 refills | Status: DC | PRN
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
## 2016-05-25 NOTE — Addendum Note (Signed)
Addended by: Luella Cook on: 05/25/2016 05:53 PM   Modules accepted: Orders

## 2016-05-25 NOTE — Progress Notes (Signed)
Pt . Here for treatment and she has nausea all the time and she vomits 1-2 times almost every day.

## 2016-05-26 ENCOUNTER — Inpatient Hospital Stay: Payer: Medicaid Other

## 2016-05-26 ENCOUNTER — Ambulatory Visit
Admission: RE | Admit: 2016-05-26 | Discharge: 2016-05-26 | Disposition: A | Discharge status: Home / Self Care | Payer: Medicaid Other | Source: Ambulatory Visit | Attending: Radiation Oncology | Admitting: Radiation Oncology

## 2016-05-26 DIAGNOSIS — Z510 Encounter for antineoplastic radiation therapy: Secondary | ICD-10-CM | POA: Diagnosis not present

## 2016-05-27 ENCOUNTER — Inpatient Hospital Stay: Payer: Medicaid Other

## 2016-05-27 ENCOUNTER — Ambulatory Visit: Payer: Medicaid Other

## 2016-05-27 ENCOUNTER — Ambulatory Visit
Admission: RE | Admit: 2016-05-27 | Discharge: 2016-05-27 | Disposition: A | Discharge status: Home / Self Care | Payer: Medicaid Other | Source: Ambulatory Visit | Attending: Radiation Oncology | Admitting: Radiation Oncology

## 2016-05-27 DIAGNOSIS — Z510 Encounter for antineoplastic radiation therapy: Secondary | ICD-10-CM | POA: Diagnosis not present

## 2016-05-28 ENCOUNTER — Ambulatory Visit
Admission: RE | Admit: 2016-05-28 | Discharge: 2016-05-28 | Disposition: A | Discharge status: Home / Self Care | Payer: Medicaid Other | Source: Ambulatory Visit | Attending: Radiation Oncology | Admitting: Radiation Oncology

## 2016-05-28 ENCOUNTER — Inpatient Hospital Stay: Payer: Medicaid Other

## 2016-05-28 DIAGNOSIS — Z510 Encounter for antineoplastic radiation therapy: Secondary | ICD-10-CM | POA: Diagnosis not present

## 2016-05-31 ENCOUNTER — Ambulatory Visit
Admission: RE | Admit: 2016-05-31 | Discharge: 2016-05-31 | Disposition: A | Discharge status: Home / Self Care | Payer: Medicaid Other | Source: Ambulatory Visit | Attending: Radiation Oncology | Admitting: Radiation Oncology

## 2016-05-31 ENCOUNTER — Inpatient Hospital Stay: Payer: Medicaid Other

## 2016-05-31 DIAGNOSIS — Z510 Encounter for antineoplastic radiation therapy: Secondary | ICD-10-CM | POA: Diagnosis not present

## 2016-06-01 ENCOUNTER — Other Ambulatory Visit: Payer: Self-pay | Admitting: *Deleted

## 2016-06-01 ENCOUNTER — Inpatient Hospital Stay: Payer: Medicaid Other

## 2016-06-01 ENCOUNTER — Ambulatory Visit
Admission: RE | Admit: 2016-06-01 | Discharge: 2016-06-01 | Disposition: A | Discharge status: Home / Self Care | Payer: Medicaid Other | Source: Ambulatory Visit | Attending: Radiation Oncology | Admitting: Radiation Oncology

## 2016-06-01 ENCOUNTER — Other Ambulatory Visit: Payer: Self-pay | Admitting: Oncology

## 2016-06-01 VITALS — BP 100/64 | HR 65 | Temp 96.6°F | Resp 18

## 2016-06-01 DIAGNOSIS — R112 Nausea with vomiting, unspecified: Secondary | ICD-10-CM

## 2016-06-01 DIAGNOSIS — R9389 Abnormal findings on diagnostic imaging of other specified body structures: Secondary | ICD-10-CM

## 2016-06-01 DIAGNOSIS — Z5111 Encounter for antineoplastic chemotherapy: Secondary | ICD-10-CM | POA: Diagnosis not present

## 2016-06-01 DIAGNOSIS — R918 Other nonspecific abnormal finding of lung field: Secondary | ICD-10-CM

## 2016-06-01 DIAGNOSIS — C3490 Malignant neoplasm of unspecified part of unspecified bronchus or lung: Secondary | ICD-10-CM

## 2016-06-01 DIAGNOSIS — C3492 Malignant neoplasm of unspecified part of left bronchus or lung: Secondary | ICD-10-CM

## 2016-06-01 DIAGNOSIS — G893 Neoplasm related pain (acute) (chronic): Secondary | ICD-10-CM

## 2016-06-01 DIAGNOSIS — F419 Anxiety disorder, unspecified: Secondary | ICD-10-CM

## 2016-06-01 DIAGNOSIS — T451X5A Adverse effect of antineoplastic and immunosuppressive drugs, initial encounter: Secondary | ICD-10-CM

## 2016-06-01 DIAGNOSIS — D701 Agranulocytosis secondary to cancer chemotherapy: Secondary | ICD-10-CM

## 2016-06-01 DIAGNOSIS — Z510 Encounter for antineoplastic radiation therapy: Secondary | ICD-10-CM | POA: Diagnosis not present

## 2016-06-01 LAB — COMPREHENSIVE METABOLIC PANEL
ALK PHOS: 54 U/L (ref 38–126)
ALT: 10 U/L — ABNORMAL LOW (ref 14–54)
ANION GAP: 4 — AB (ref 5–15)
AST: 16 U/L (ref 15–41)
Albumin: 3.9 g/dL (ref 3.5–5.0)
BILIRUBIN TOTAL: 0.3 mg/dL (ref 0.3–1.2)
BUN: 14 mg/dL (ref 6–20)
CALCIUM: 9.1 mg/dL (ref 8.9–10.3)
CO2: 26 mmol/L (ref 22–32)
Chloride: 107 mmol/L (ref 101–111)
Creatinine, Ser: 0.83 mg/dL (ref 0.44–1.00)
Glucose, Bld: 97 mg/dL (ref 65–99)
Potassium: 3.5 mmol/L (ref 3.5–5.1)
Sodium: 137 mmol/L (ref 135–145)
TOTAL PROTEIN: 7.1 g/dL (ref 6.5–8.1)

## 2016-06-01 LAB — CBC WITH DIFFERENTIAL/PLATELET
BASOS ABS: 0 10*3/uL (ref 0–0.1)
BASOS PCT: 1 %
Eosinophils Absolute: 0 10*3/uL (ref 0–0.7)
Eosinophils Relative: 1 %
HEMATOCRIT: 38.1 % (ref 35.0–47.0)
HEMOGLOBIN: 13.4 g/dL (ref 12.0–16.0)
Lymphocytes Relative: 19 %
Lymphs Abs: 0.7 10*3/uL — ABNORMAL LOW (ref 1.0–3.6)
MCH: 33.3 pg (ref 26.0–34.0)
MCHC: 35.2 g/dL (ref 32.0–36.0)
MCV: 94.8 fL (ref 80.0–100.0)
MONO ABS: 0.6 10*3/uL (ref 0.2–0.9)
Monocytes Relative: 17 %
NEUTROS PCT: 62 %
Neutro Abs: 2.2 10*3/uL (ref 1.4–6.5)
Platelets: 223 10*3/uL (ref 150–440)
RBC: 4.02 MIL/uL (ref 3.80–5.20)
RDW: 13.8 % (ref 11.5–14.5)
WBC: 3.5 10*3/uL — ABNORMAL LOW (ref 3.6–11.0)

## 2016-06-01 MED ORDER — FOSAPREPITANT DIMEGLUMINE INJECTION 150 MG
Freq: Once | INTRAVENOUS | Status: AC
  Administered 2016-06-01: 12:00:00 via INTRAVENOUS
  Filled 2016-06-01: qty 5

## 2016-06-01 MED ORDER — DEXAMETHASONE SODIUM PHOSPHATE 10 MG/ML IJ SOLN: 10.0000 mg | Freq: Once | INTRAMUSCULAR | Status: DC

## 2016-06-01 MED ORDER — SODIUM CHLORIDE 0.9 % IV SOLN
100.0000 mg/m2 | Freq: Once | INTRAVENOUS | Status: AC
  Administered 2016-06-01: 170 mg via INTRAVENOUS
  Filled 2016-06-01: qty 8.5

## 2016-06-01 MED ORDER — SODIUM CHLORIDE 0.9% FLUSH
10.0000 mL | INTRAVENOUS | Status: DC | PRN
  Administered 2016-06-01: 10 mL via INTRAVENOUS
  Filled 2016-06-01: qty 10

## 2016-06-01 MED ORDER — QUETIAPINE FUMARATE 25 MG PO TABS: 25.0000 mg | ORAL_TABLET | Freq: Every day | ORAL | 1 refills | Status: DC

## 2016-06-01 MED ORDER — HEPARIN SOD (PORK) LOCK FLUSH 100 UNIT/ML IV SOLN
500.0000 [IU] | Freq: Once | INTRAVENOUS | Status: AC
  Administered 2016-06-01: 500 [IU] via INTRAVENOUS
  Filled 2016-06-01: qty 5

## 2016-06-01 MED ORDER — SODIUM CHLORIDE 0.9 % IV SOLN
Freq: Once | INTRAVENOUS | Status: AC
  Administered 2016-06-01: 10:00:00 via INTRAVENOUS
  Filled 2016-06-01: qty 1000

## 2016-06-01 MED ORDER — PALONOSETRON HCL INJECTION 0.25 MG/5ML
0.2500 mg | Freq: Once | INTRAVENOUS | Status: AC
  Administered 2016-06-01: 0.25 mg via INTRAVENOUS
  Filled 2016-06-01: qty 5

## 2016-06-01 MED ORDER — SODIUM CHLORIDE 0.9 % IV SOLN
80.0000 mg/m2 | Freq: Once | INTRAVENOUS | Status: AC
  Administered 2016-06-01: 135 mg via INTRAVENOUS
  Filled 2016-06-01: qty 50

## 2016-06-01 MED ORDER — POTASSIUM CHLORIDE 2 MEQ/ML IV SOLN
Freq: Once | INTRAVENOUS | Status: AC
  Administered 2016-06-01: 10:00:00 via INTRAVENOUS
  Filled 2016-06-01: qty 1000

## 2016-06-01 NOTE — Telephone Encounter (Signed)
This was already refilled today, Jasmine Young informed

## 2016-06-02 ENCOUNTER — Inpatient Hospital Stay: Payer: Medicaid Other

## 2016-06-02 ENCOUNTER — Ambulatory Visit
Admission: RE | Admit: 2016-06-02 | Discharge: 2016-06-02 | Disposition: A | Discharge status: Home / Self Care | Payer: Medicaid Other | Source: Ambulatory Visit | Attending: Radiation Oncology | Admitting: Radiation Oncology

## 2016-06-02 VITALS — BP 100/64 | HR 70 | Resp 20

## 2016-06-02 DIAGNOSIS — C3490 Malignant neoplasm of unspecified part of unspecified bronchus or lung: Secondary | ICD-10-CM

## 2016-06-02 DIAGNOSIS — Z510 Encounter for antineoplastic radiation therapy: Secondary | ICD-10-CM | POA: Diagnosis not present

## 2016-06-02 DIAGNOSIS — Z5111 Encounter for antineoplastic chemotherapy: Secondary | ICD-10-CM | POA: Diagnosis not present

## 2016-06-02 MED ORDER — DEXAMETHASONE SODIUM PHOSPHATE 10 MG/ML IJ SOLN
10.0000 mg | Freq: Once | INTRAMUSCULAR | Status: AC
  Administered 2016-06-02: 10 mg via INTRAVENOUS
  Filled 2016-06-02: qty 1

## 2016-06-02 MED ORDER — ETOPOSIDE CHEMO INJECTION 1 GM/50ML
100.0000 mg/m2 | Freq: Once | INTRAVENOUS | Status: AC
  Administered 2016-06-02: 170 mg via INTRAVENOUS
  Filled 2016-06-02: qty 8.5

## 2016-06-02 MED ORDER — SODIUM CHLORIDE 0.9 % IV SOLN
Freq: Once | INTRAVENOUS | Status: AC
  Administered 2016-06-02: 11:00:00 via INTRAVENOUS
  Filled 2016-06-02: qty 1000

## 2016-06-02 MED ORDER — HEPARIN SOD (PORK) LOCK FLUSH 100 UNIT/ML IV SOLN
500.0000 [IU] | Freq: Once | INTRAVENOUS | Status: AC | PRN
  Administered 2016-06-02: 500 [IU]
  Filled 2016-06-02 (×2): qty 5

## 2016-06-03 ENCOUNTER — Ambulatory Visit
Admission: RE | Admit: 2016-06-03 | Discharge: 2016-06-03 | Disposition: A | Discharge status: Home / Self Care | Payer: Medicaid Other | Source: Ambulatory Visit | Attending: Radiation Oncology | Admitting: Radiation Oncology

## 2016-06-03 ENCOUNTER — Inpatient Hospital Stay: Payer: Medicaid Other

## 2016-06-03 ENCOUNTER — Other Ambulatory Visit: Payer: Self-pay | Admitting: Oncology

## 2016-06-03 VITALS — BP 100/65 | HR 76 | Temp 96.2°F | Resp 18

## 2016-06-03 DIAGNOSIS — G893 Neoplasm related pain (acute) (chronic): Secondary | ICD-10-CM

## 2016-06-03 DIAGNOSIS — C3490 Malignant neoplasm of unspecified part of unspecified bronchus or lung: Secondary | ICD-10-CM

## 2016-06-03 DIAGNOSIS — Z5111 Encounter for antineoplastic chemotherapy: Secondary | ICD-10-CM | POA: Diagnosis not present

## 2016-06-03 DIAGNOSIS — C3492 Malignant neoplasm of unspecified part of left bronchus or lung: Secondary | ICD-10-CM

## 2016-06-03 DIAGNOSIS — Z510 Encounter for antineoplastic radiation therapy: Secondary | ICD-10-CM | POA: Diagnosis not present

## 2016-06-03 DIAGNOSIS — D701 Agranulocytosis secondary to cancer chemotherapy: Secondary | ICD-10-CM

## 2016-06-03 DIAGNOSIS — R112 Nausea with vomiting, unspecified: Secondary | ICD-10-CM

## 2016-06-03 DIAGNOSIS — T451X5A Adverse effect of antineoplastic and immunosuppressive drugs, initial encounter: Secondary | ICD-10-CM

## 2016-06-03 MED ORDER — SODIUM CHLORIDE 0.9 % IV SOLN
100.0000 mg/m2 | Freq: Once | INTRAVENOUS | Status: AC
  Administered 2016-06-03: 170 mg via INTRAVENOUS
  Filled 2016-06-03: qty 8.5

## 2016-06-03 MED ORDER — HEPARIN SOD (PORK) LOCK FLUSH 100 UNIT/ML IV SOLN
INTRAVENOUS | Status: AC
  Filled 2016-06-03: qty 5

## 2016-06-03 MED ORDER — HEPARIN SOD (PORK) LOCK FLUSH 100 UNIT/ML IV SOLN
500.0000 [IU] | Freq: Once | INTRAVENOUS | Status: AC
  Administered 2016-06-03: 500 [IU] via INTRAVENOUS

## 2016-06-03 MED ORDER — SODIUM CHLORIDE 0.9 % IV SOLN
Freq: Once | INTRAVENOUS | Status: AC
  Administered 2016-06-03: 12:00:00 via INTRAVENOUS
  Filled 2016-06-03: qty 1000

## 2016-06-03 MED ORDER — SODIUM CHLORIDE 0.9% FLUSH
10.0000 mL | INTRAVENOUS | Status: DC | PRN
  Administered 2016-06-03: 10 mL via INTRAVENOUS
  Filled 2016-06-03: qty 10

## 2016-06-03 MED ORDER — DEXAMETHASONE SODIUM PHOSPHATE 10 MG/ML IJ SOLN
10.0000 mg | Freq: Once | INTRAMUSCULAR | Status: AC
  Administered 2016-06-03: 10 mg via INTRAVENOUS
  Filled 2016-06-03: qty 1

## 2016-06-03 NOTE — Progress Notes (Signed)
Patient c/o right ear pain and draining.  This is not a new problem and was actually seeing [facility] ENT before cancer diagnoses.  Advised patient should contact ENT regarding ear pain and drainage.

## 2016-06-03 NOTE — Telephone Encounter (Signed)
I called [facility] and said pt can have a refill of compazine but o thought she already had one and she does and they will refill it with refill on chart. Lorazepam no more refills and I have authorized a refill. I have tried calling to husband and no answer and no voicemail set up.  I got jack on the phone and he is agreeable to pay for copays on [employer] and staff will keep this on records for her.

## 2016-06-04 ENCOUNTER — Inpatient Hospital Stay: Payer: Medicaid Other

## 2016-06-04 ENCOUNTER — Ambulatory Visit
Admission: RE | Admit: 2016-06-04 | Discharge: 2016-06-04 | Disposition: A | Discharge status: Home / Self Care | Payer: Medicaid Other | Source: Ambulatory Visit | Attending: Radiation Oncology | Admitting: Radiation Oncology

## 2016-06-04 DIAGNOSIS — Z510 Encounter for antineoplastic radiation therapy: Secondary | ICD-10-CM | POA: Diagnosis not present

## 2016-06-06 ENCOUNTER — Ambulatory Visit: Payer: Medicaid Other

## 2016-06-07 ENCOUNTER — Ambulatory Visit: Payer: Medicaid Other

## 2016-06-07 ENCOUNTER — Inpatient Hospital Stay: Payer: Medicaid Other

## 2016-06-08 ENCOUNTER — Ambulatory Visit: Payer: Medicaid Other

## 2016-06-08 ENCOUNTER — Inpatient Hospital Stay: Payer: Medicaid Other

## 2016-06-09 ENCOUNTER — Inpatient Hospital Stay: Payer: Medicaid Other

## 2016-06-09 ENCOUNTER — Telehealth: Payer: Self-pay | Admitting: *Deleted

## 2016-06-09 ENCOUNTER — Emergency Department: Payer: Medicaid Other

## 2016-06-09 ENCOUNTER — Encounter: Payer: Self-pay | Admitting: *Deleted

## 2016-06-09 ENCOUNTER — Ambulatory Visit: Payer: Medicaid Other

## 2016-06-09 ENCOUNTER — Other Ambulatory Visit: Payer: Self-pay

## 2016-06-09 ENCOUNTER — Emergency Department
Admission: EM | Admit: 2016-06-09 | Discharge: 2016-06-09 | Disposition: A | Discharge status: Home / Self Care | Payer: Medicaid Other | Attending: Emergency Medicine | Admitting: Emergency Medicine

## 2016-06-09 DIAGNOSIS — F17210 Nicotine dependence, cigarettes, uncomplicated: Secondary | ICD-10-CM | POA: Insufficient documentation

## 2016-06-09 DIAGNOSIS — J45909 Unspecified asthma, uncomplicated: Secondary | ICD-10-CM | POA: Insufficient documentation

## 2016-06-09 DIAGNOSIS — K209 Esophagitis, unspecified without bleeding: Secondary | ICD-10-CM

## 2016-06-09 DIAGNOSIS — R0789 Other chest pain: Secondary | ICD-10-CM | POA: Diagnosis present

## 2016-06-09 LAB — BASIC METABOLIC PANEL
ANION GAP: 8 (ref 5–15)
BUN: 15 mg/dL (ref 6–20)
CO2: 25 mmol/L (ref 22–32)
Calcium: 9 mg/dL (ref 8.9–10.3)
Chloride: 101 mmol/L (ref 101–111)
Creatinine, Ser: 0.76 mg/dL (ref 0.44–1.00)
GFR calc Af Amer: >60 mL/min (ref >60)
GFR calc non Af Amer: >60 mL/min (ref >60)
GLUCOSE: 92 mg/dL (ref 65–99)
POTASSIUM: 3.5 mmol/L (ref 3.5–5.1)
Sodium: 134 mmol/L — ABNORMAL LOW (ref 135–145)

## 2016-06-09 LAB — CBC WITH DIFFERENTIAL/PLATELET
Basophils Absolute: 0 10*3/uL (ref 0–0.1)
Basophils Relative: 0 %
EOS PCT: 1 %
Eosinophils Absolute: 0 10*3/uL (ref 0–0.7)
HCT: 35.5 % (ref 35.0–47.0)
Hemoglobin: 12.5 g/dL (ref 12.0–16.0)
LYMPHS ABS: 0.4 10*3/uL — AB (ref 1.0–3.6)
LYMPHS PCT: 11 %
MCH: 32.8 pg (ref 26.0–34.0)
MCHC: 35.2 g/dL (ref 32.0–36.0)
MCV: 93.3 fL (ref 80.0–100.0)
MONO ABS: 0.2 10*3/uL (ref 0.2–0.9)
MONOS PCT: 5 %
Neutro Abs: 2.9 10*3/uL (ref 1.4–6.5)
Neutrophils Relative %: 83 %
PLATELETS: 147 10*3/uL — AB (ref 150–440)
RBC: 3.81 MIL/uL (ref 3.80–5.20)
RDW: 13.5 % (ref 11.5–14.5)
WBC: 3.5 10*3/uL — ABNORMAL LOW (ref 3.6–11.0)

## 2016-06-09 LAB — TROPONIN I: Troponin I: <0.03 ng/mL (ref <0.03)

## 2016-06-09 MED ORDER — GI COCKTAIL ~~LOC~~
ORAL | Status: AC
  Administered 2016-06-09: 30 mL via ORAL
  Filled 2016-06-09: qty 30

## 2016-06-09 MED ORDER — SUCRALFATE 1 G PO TABS: 1.0000 g | ORAL_TABLET | Freq: Four times a day (QID) | ORAL | 0 refills | Status: DC

## 2016-06-09 MED ORDER — GI COCKTAIL ~~LOC~~
30.0000 mL | Freq: Once | ORAL | Status: AC
  Administered 2016-06-09: 30 mL via ORAL

## 2016-06-09 MED ORDER — FAMOTIDINE 40 MG PO TABS
40.0000 mg | ORAL_TABLET | Freq: Every evening | ORAL | 1 refills | Status: AC
Start: 2016-06-09 — End: 2017-06-09

## 2016-06-09 NOTE — Telephone Encounter (Signed)
Pt's husband called to report that pt is in route to ED at this time due to symptoms of inability to tolerate anything PO, difficulty swallowing, burning with swallowing, extreme weakness, nausea/vomiting, vomiting blood this am. Pt has been experiencing symptoms for the past few days and have worsened today. Due to no transportation, pt's husband called EMS to take the pt to the hospital for further evaluation. Taxi was called for pt's husband to accompany her while in the ED. Pt's husband wanted to inform Dr. Janese Banks and Dr. Baruch Gouty with current status of patient. Informed pt's husband that I would send them a message and to let me know if he needs anything else. Pt's husband verbalized understanding.

## 2016-06-09 NOTE — Discharge Instructions (Signed)
Please seek medical attention for any high fevers, chest pain, shortness of breath, change in behavior, persistent vomiting, bloody stool or any other new or concerning symptoms.  

## 2016-06-09 NOTE — Telephone Encounter (Signed)
Jasmine Young called and stated she is threatening to call ambulance to come get her because she is in so much pain. When looking at her chart, she is in the ER. I called ER and was told her has only just arrived 4 minutes ago, but she will tell the nurse

## 2016-06-09 NOTE — ED Triage Notes (Signed)
Pt to ED from home reporting multiple days of centralized chest pressure that worsened today. Pt reports 10/10 pain that increases when pressure is applied to chest. PT reports SOB and dizziness. Pt is current lung cancer pt and undergoes radiation. Pt has not been to treatments recently due to reported nausea and vomiting.
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
## 2016-06-10 ENCOUNTER — Ambulatory Visit: Payer: Medicaid Other

## 2016-06-10 ENCOUNTER — Telehealth: Payer: Self-pay | Admitting: *Deleted

## 2016-06-10 ENCOUNTER — Inpatient Hospital Stay: Payer: Medicaid Other

## 2016-06-10 DIAGNOSIS — C3490 Malignant neoplasm of unspecified part of unspecified bronchus or lung: Secondary | ICD-10-CM

## 2016-06-10 NOTE — Telephone Encounter (Signed)
Pt's husband called to get follow up appt scheduled with Dr. Janese Banks. Per Judeen Hammans, Dr. Janese Banks can see the patient next week and needs to continue GI medication. Pt scheduled to see Dr. Janese Banks on Tuesday 5/29 with labs. Spoke with Dr. Baruch Gouty who stated will hold radiation treatment until Tuesday and then re-evaluate patient when comes for f/u with Janese Banks. Per Dr. Baruch Gouty, may call in dexamethasone if symptoms persist. Pt's husband instructed that pt needs to continue Pepcid that ER physician prescribed and that the pt will follow up on Tuesday with Dr. Janese Banks and Dr. Baruch Gouty. Radiation treatment for Friday was cancelled and the [facility]ian[facility] Lei is scheduled to pick up the patient at 2pm on Tuesday. Instructed pt's husband to call tomorrow afternoon if symptoms worsen so prescription can be called into pharmacy prior to the holiday weekend. Jeneen Rinks verbalized understanding.

## 2016-06-11 ENCOUNTER — Inpatient Hospital Stay: Payer: Medicaid Other

## 2016-06-11 ENCOUNTER — Telehealth: Payer: Self-pay | Admitting: *Deleted

## 2016-06-11 ENCOUNTER — Ambulatory Visit: Payer: Medicaid Other

## 2016-06-11 MED ORDER — DEXAMETHASONE 4 MG PO TABS: 4.0000 mg | ORAL_TABLET | Freq: Every day | ORAL | 0 refills | Status: DC

## 2016-06-11 NOTE — Telephone Encounter (Signed)
Called asking for steroids, this was handled by another nurse already

## 2016-06-11 NOTE — Telephone Encounter (Signed)
Pt having burning and difficulty swallowing. Per Dr. Baruch Gouty, will call in presciption for dexamethasone to be tapered as directed. Prescription escribed to [facility] pharmacy per pt request.

## 2016-06-15 ENCOUNTER — Ambulatory Visit: Payer: Medicaid Other

## 2016-06-15 ENCOUNTER — Encounter: Payer: Self-pay | Admitting: Oncology

## 2016-06-15 ENCOUNTER — Inpatient Hospital Stay: Payer: Medicaid Other

## 2016-06-15 ENCOUNTER — Inpatient Hospital Stay (HOSPITAL_BASED_OUTPATIENT_CLINIC_OR_DEPARTMENT_OTHER): Payer: Medicaid Other | Admitting: Oncology

## 2016-06-15 ENCOUNTER — Other Ambulatory Visit: Payer: Self-pay | Admitting: *Deleted

## 2016-06-15 ENCOUNTER — Ambulatory Visit: Admission: RE | Admit: 2016-06-15 | Payer: Medicaid Other | Source: Ambulatory Visit

## 2016-06-15 DIAGNOSIS — G893 Neoplasm related pain (acute) (chronic): Secondary | ICD-10-CM

## 2016-06-15 DIAGNOSIS — K208 Other esophagitis: Secondary | ICD-10-CM | POA: Diagnosis not present

## 2016-06-15 DIAGNOSIS — D701 Agranulocytosis secondary to cancer chemotherapy: Secondary | ICD-10-CM

## 2016-06-15 DIAGNOSIS — C3490 Malignant neoplasm of unspecified part of unspecified bronchus or lung: Secondary | ICD-10-CM

## 2016-06-15 DIAGNOSIS — R1310 Dysphagia, unspecified: Secondary | ICD-10-CM

## 2016-06-15 DIAGNOSIS — R590 Localized enlarged lymph nodes: Secondary | ICD-10-CM | POA: Diagnosis not present

## 2016-06-15 DIAGNOSIS — G40909 Epilepsy, unspecified, not intractable, without status epilepticus: Secondary | ICD-10-CM | POA: Diagnosis not present

## 2016-06-15 DIAGNOSIS — T451X5A Adverse effect of antineoplastic and immunosuppressive drugs, initial encounter: Secondary | ICD-10-CM

## 2016-06-15 DIAGNOSIS — F319 Bipolar disorder, unspecified: Secondary | ICD-10-CM

## 2016-06-15 DIAGNOSIS — C3411 Malignant neoplasm of upper lobe, right bronchus or lung: Secondary | ICD-10-CM | POA: Diagnosis not present

## 2016-06-15 DIAGNOSIS — D72819 Decreased white blood cell count, unspecified: Secondary | ICD-10-CM | POA: Diagnosis not present

## 2016-06-15 DIAGNOSIS — R079 Chest pain, unspecified: Secondary | ICD-10-CM

## 2016-06-15 DIAGNOSIS — Z9181 History of falling: Secondary | ICD-10-CM

## 2016-06-15 DIAGNOSIS — D709 Neutropenia, unspecified: Secondary | ICD-10-CM | POA: Diagnosis not present

## 2016-06-15 DIAGNOSIS — R12 Heartburn: Secondary | ICD-10-CM

## 2016-06-15 DIAGNOSIS — F419 Anxiety disorder, unspecified: Secondary | ICD-10-CM

## 2016-06-15 DIAGNOSIS — Z79899 Other long term (current) drug therapy: Secondary | ICD-10-CM

## 2016-06-15 DIAGNOSIS — F17210 Nicotine dependence, cigarettes, uncomplicated: Secondary | ICD-10-CM

## 2016-06-15 DIAGNOSIS — E279 Disorder of adrenal gland, unspecified: Secondary | ICD-10-CM

## 2016-06-15 DIAGNOSIS — R112 Nausea with vomiting, unspecified: Secondary | ICD-10-CM

## 2016-06-15 DIAGNOSIS — Z8541 Personal history of malignant neoplasm of cervix uteri: Secondary | ICD-10-CM

## 2016-06-15 DIAGNOSIS — Z5111 Encounter for antineoplastic chemotherapy: Secondary | ICD-10-CM | POA: Diagnosis not present

## 2016-06-15 DIAGNOSIS — C3492 Malignant neoplasm of unspecified part of left bronchus or lung: Secondary | ICD-10-CM

## 2016-06-15 DIAGNOSIS — G43909 Migraine, unspecified, not intractable, without status migrainosus: Secondary | ICD-10-CM

## 2016-06-15 DIAGNOSIS — R634 Abnormal weight loss: Secondary | ICD-10-CM

## 2016-06-15 LAB — CBC WITH DIFFERENTIAL/PLATELET
BASOS ABS: 0 10*3/uL (ref 0–0.1)
BASOS PCT: 1 %
EOS PCT: 0 %
Eosinophils Absolute: 0 10*3/uL (ref 0–0.7)
HCT: 30.5 % — ABNORMAL LOW (ref 35.0–47.0)
Hemoglobin: 10.7 g/dL — ABNORMAL LOW (ref 12.0–16.0)
LYMPHS PCT: 19 %
Lymphs Abs: 0.4 10*3/uL — ABNORMAL LOW (ref 1.0–3.6)
MCH: 33.7 pg (ref 26.0–34.0)
MCHC: 35.3 g/dL (ref 32.0–36.0)
MCV: 95.7 fL (ref 80.0–100.0)
Monocytes Absolute: 0.3 10*3/uL (ref 0.2–0.9)
Monocytes Relative: 12 %
Neutro Abs: 1.5 10*3/uL (ref 1.4–6.5)
Neutrophils Relative %: 68 %
PLATELETS: 197 10*3/uL (ref 150–440)
RBC: 3.18 MIL/uL — AB (ref 3.80–5.20)
RDW: 13.6 % (ref 11.5–14.5)
WBC: 2.2 10*3/uL — AB (ref 3.6–11.0)

## 2016-06-15 LAB — COMPREHENSIVE METABOLIC PANEL
ALBUMIN: 4 g/dL (ref 3.5–5.0)
ALT: 15 U/L (ref 14–54)
AST: 19 U/L (ref 15–41)
Alkaline Phosphatase: 51 U/L (ref 38–126)
Anion gap: 5 (ref 5–15)
BUN: 18 mg/dL (ref 6–20)
CHLORIDE: 104 mmol/L (ref 101–111)
CO2: 27 mmol/L (ref 22–32)
CREATININE: 0.91 mg/dL (ref 0.44–1.00)
Calcium: 8.9 mg/dL (ref 8.9–10.3)
GFR calc Af Amer: >60 mL/min (ref >60)
GLUCOSE: 101 mg/dL — AB (ref 65–99)
POTASSIUM: 3.6 mmol/L (ref 3.5–5.1)
SODIUM: 136 mmol/L (ref 135–145)
Total Bilirubin: 0.4 mg/dL (ref 0.3–1.2)
Total Protein: 7.3 g/dL (ref 6.5–8.1)

## 2016-06-15 MED ORDER — OXYCODONE HCL 10 MG PO TABS: 10.0000 mg | ORAL_TABLET | Freq: Three times a day (TID) | ORAL | 0 refills | Status: DC | PRN

## 2016-06-15 MED ORDER — DEXAMETHASONE 4 MG PO TABS
4.0000 mg | ORAL_TABLET | Freq: Two times a day (BID) | ORAL | 0 refills | Status: DC
Start: 2016-06-15 — End: 2016-10-15

## 2016-06-15 NOTE — Progress Notes (Signed)
Patient here for follow up she is have severe throat and neck pain, she describes it as burning she is unable to eat or drink anything and has begun to have pain through out her body.
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
## 2016-06-16 ENCOUNTER — Inpatient Hospital Stay: Payer: Medicaid Other

## 2016-06-16 ENCOUNTER — Ambulatory Visit: Payer: Medicaid Other

## 2016-06-17 ENCOUNTER — Inpatient Hospital Stay: Payer: Medicaid Other

## 2016-06-17 ENCOUNTER — Ambulatory Visit: Payer: Medicaid Other

## 2016-06-18 ENCOUNTER — Ambulatory Visit: Payer: Medicaid Other

## 2016-06-18 ENCOUNTER — Inpatient Hospital Stay: Payer: Medicaid Other

## 2016-06-21 ENCOUNTER — Ambulatory Visit
Admission: RE | Admit: 2016-06-21 | Discharge: 2016-06-21 | Disposition: A | Discharge status: Home / Self Care | Payer: Medicaid Other | Source: Ambulatory Visit | Attending: Radiation Oncology | Admitting: Radiation Oncology

## 2016-06-21 ENCOUNTER — Ambulatory Visit: Payer: Medicaid Other

## 2016-06-21 DIAGNOSIS — Z510 Encounter for antineoplastic radiation therapy: Secondary | ICD-10-CM | POA: Diagnosis not present

## 2016-06-22 ENCOUNTER — Inpatient Hospital Stay: Payer: Medicaid Other

## 2016-06-22 ENCOUNTER — Ambulatory Visit: Payer: Medicaid Other

## 2016-06-22 ENCOUNTER — Inpatient Hospital Stay: Payer: Medicaid Other | Admitting: Oncology

## 2016-06-22 ENCOUNTER — Ambulatory Visit
Admission: RE | Admit: 2016-06-22 | Discharge: 2016-06-22 | Disposition: A | Discharge status: Home / Self Care | Payer: Medicaid Other | Source: Ambulatory Visit | Attending: Radiation Oncology | Admitting: Radiation Oncology

## 2016-06-22 ENCOUNTER — Inpatient Hospital Stay: Payer: Medicaid Other | Attending: Oncology

## 2016-06-22 VITALS — BP 100/65 | HR 58 | Temp 97.3°F | Resp 16

## 2016-06-22 DIAGNOSIS — R0789 Other chest pain: Secondary | ICD-10-CM | POA: Diagnosis not present

## 2016-06-22 DIAGNOSIS — F17210 Nicotine dependence, cigarettes, uncomplicated: Secondary | ICD-10-CM | POA: Diagnosis not present

## 2016-06-22 DIAGNOSIS — G40909 Epilepsy, unspecified, not intractable, without status epilepticus: Secondary | ICD-10-CM | POA: Diagnosis not present

## 2016-06-22 DIAGNOSIS — R590 Localized enlarged lymph nodes: Secondary | ICD-10-CM | POA: Diagnosis not present

## 2016-06-22 DIAGNOSIS — E876 Hypokalemia: Secondary | ICD-10-CM | POA: Insufficient documentation

## 2016-06-22 DIAGNOSIS — F419 Anxiety disorder, unspecified: Secondary | ICD-10-CM | POA: Insufficient documentation

## 2016-06-22 DIAGNOSIS — R634 Abnormal weight loss: Secondary | ICD-10-CM | POA: Insufficient documentation

## 2016-06-22 DIAGNOSIS — F329 Major depressive disorder, single episode, unspecified: Secondary | ICD-10-CM | POA: Diagnosis not present

## 2016-06-22 DIAGNOSIS — F4310 Post-traumatic stress disorder, unspecified: Secondary | ICD-10-CM | POA: Diagnosis not present

## 2016-06-22 DIAGNOSIS — Z510 Encounter for antineoplastic radiation therapy: Secondary | ICD-10-CM | POA: Diagnosis not present

## 2016-06-22 DIAGNOSIS — Z8669 Personal history of other diseases of the nervous system and sense organs: Secondary | ICD-10-CM | POA: Insufficient documentation

## 2016-06-22 DIAGNOSIS — Z79899 Other long term (current) drug therapy: Secondary | ICD-10-CM | POA: Insufficient documentation

## 2016-06-22 DIAGNOSIS — C3412 Malignant neoplasm of upper lobe, left bronchus or lung: Secondary | ICD-10-CM | POA: Insufficient documentation

## 2016-06-22 DIAGNOSIS — D6481 Anemia due to antineoplastic chemotherapy: Secondary | ICD-10-CM | POA: Insufficient documentation

## 2016-06-22 DIAGNOSIS — C3492 Malignant neoplasm of unspecified part of left bronchus or lung: Secondary | ICD-10-CM

## 2016-06-22 DIAGNOSIS — E279 Disorder of adrenal gland, unspecified: Secondary | ICD-10-CM | POA: Insufficient documentation

## 2016-06-22 DIAGNOSIS — C3490 Malignant neoplasm of unspecified part of unspecified bronchus or lung: Secondary | ICD-10-CM

## 2016-06-22 DIAGNOSIS — Z5111 Encounter for antineoplastic chemotherapy: Secondary | ICD-10-CM | POA: Insufficient documentation

## 2016-06-22 LAB — CBC WITH DIFFERENTIAL/PLATELET
Basophils Absolute: 0 10*3/uL (ref 0–0.1)
Basophils Relative: 0 %
EOS ABS: 0 10*3/uL (ref 0–0.7)
Eosinophils Relative: 0 %
HEMATOCRIT: 29.5 % — AB (ref 35.0–47.0)
HEMOGLOBIN: 10.4 g/dL — AB (ref 12.0–16.0)
LYMPHS PCT: 19 %
Lymphs Abs: 0.5 10*3/uL — ABNORMAL LOW (ref 1.0–3.6)
MCH: 34.7 pg — ABNORMAL HIGH (ref 26.0–34.0)
MCHC: 35.3 g/dL (ref 32.0–36.0)
MCV: 98.3 fL (ref 80.0–100.0)
MONOS PCT: 11 %
Monocytes Absolute: 0.3 10*3/uL (ref 0.2–0.9)
NEUTROS PCT: 70 %
Neutro Abs: 2 10*3/uL (ref 1.4–6.5)
Platelets: 241 10*3/uL (ref 150–440)
RBC: 3 MIL/uL — ABNORMAL LOW (ref 3.80–5.20)
RDW: 15.7 % — ABNORMAL HIGH (ref 11.5–14.5)
WBC: 2.9 10*3/uL — ABNORMAL LOW (ref 3.6–11.0)

## 2016-06-22 LAB — COMPREHENSIVE METABOLIC PANEL
ALBUMIN: 3.5 g/dL (ref 3.5–5.0)
ALK PHOS: 41 U/L (ref 38–126)
ALT: 11 U/L — ABNORMAL LOW (ref 14–54)
AST: 19 U/L (ref 15–41)
Anion gap: 6 (ref 5–15)
BILIRUBIN TOTAL: 0.4 mg/dL (ref 0.3–1.2)
BUN: 16 mg/dL (ref 6–20)
CALCIUM: 8.4 mg/dL — AB (ref 8.9–10.3)
CO2: 27 mmol/L (ref 22–32)
Chloride: 105 mmol/L (ref 101–111)
Creatinine, Ser: 0.97 mg/dL (ref 0.44–1.00)
GFR calc Af Amer: >60 mL/min (ref >60)
GFR calc non Af Amer: >60 mL/min (ref >60)
GLUCOSE: 125 mg/dL — AB (ref 65–99)
Potassium: 3.2 mmol/L — ABNORMAL LOW (ref 3.5–5.1)
Sodium: 138 mmol/L (ref 135–145)
Total Protein: 6.3 g/dL — ABNORMAL LOW (ref 6.5–8.1)

## 2016-06-22 MED ORDER — HEPARIN SOD (PORK) LOCK FLUSH 100 UNIT/ML IV SOLN
500.0000 [IU] | Freq: Once | INTRAVENOUS | Status: AC | PRN
  Administered 2016-06-22: 500 [IU]
  Filled 2016-06-22: qty 5

## 2016-06-22 MED ORDER — SODIUM CHLORIDE 0.9 % IV SOLN
Freq: Once | INTRAVENOUS | Status: AC
  Administered 2016-06-22: 12:00:00 via INTRAVENOUS
  Filled 2016-06-22: qty 5

## 2016-06-22 MED ORDER — HEPARIN SOD (PORK) LOCK FLUSH 100 UNIT/ML IV SOLN: 500.0000 [IU] | Freq: Once | INTRAVENOUS | Status: DC

## 2016-06-22 MED ORDER — DEXAMETHASONE SODIUM PHOSPHATE 10 MG/ML IJ SOLN: 10.0000 mg | Freq: Once | INTRAMUSCULAR | Status: DC

## 2016-06-22 MED ORDER — SODIUM CHLORIDE 0.9% FLUSH
10.0000 mL | INTRAVENOUS | Status: DC | PRN
  Administered 2016-06-22: 10 mL via INTRAVENOUS
  Filled 2016-06-22: qty 10

## 2016-06-22 MED ORDER — SODIUM CHLORIDE 0.9 % IV SOLN
Freq: Once | INTRAVENOUS | Status: DC
  Administered 2016-06-22: 10:00:00 via INTRAVENOUS
  Filled 2016-06-22: qty 1000

## 2016-06-22 MED ORDER — SODIUM CHLORIDE 0.9 % IV SOLN
100.0000 mg/m2 | Freq: Once | INTRAVENOUS | Status: AC
  Administered 2016-06-22: 170 mg via INTRAVENOUS
  Filled 2016-06-22: qty 8.5

## 2016-06-22 MED ORDER — SODIUM CHLORIDE 0.9 % IV SOLN
80.0000 mg/m2 | Freq: Once | INTRAVENOUS | Status: AC
  Administered 2016-06-22: 135 mg via INTRAVENOUS
  Filled 2016-06-22: qty 100

## 2016-06-22 MED ORDER — PALONOSETRON HCL INJECTION 0.25 MG/5ML
0.2500 mg | Freq: Once | INTRAVENOUS | Status: AC
  Administered 2016-06-22: 0.25 mg via INTRAVENOUS
  Filled 2016-06-22: qty 5

## 2016-06-22 MED ORDER — POTASSIUM CHLORIDE 2 MEQ/ML IV SOLN
Freq: Once | INTRAVENOUS | Status: DC
  Administered 2016-06-22: 10:00:00 via INTRAVENOUS
  Filled 2016-06-22: qty 1000

## 2016-06-23 ENCOUNTER — Ambulatory Visit: Payer: Medicaid Other

## 2016-06-23 ENCOUNTER — Inpatient Hospital Stay: Payer: Medicaid Other

## 2016-06-23 ENCOUNTER — Telehealth: Payer: Self-pay | Admitting: *Deleted

## 2016-06-23 ENCOUNTER — Other Ambulatory Visit: Payer: Self-pay | Admitting: *Deleted

## 2016-06-23 VITALS — BP 104/68 | HR 69 | Temp 97.6°F | Resp 18

## 2016-06-23 DIAGNOSIS — E876 Hypokalemia: Secondary | ICD-10-CM

## 2016-06-23 DIAGNOSIS — Z5111 Encounter for antineoplastic chemotherapy: Secondary | ICD-10-CM | POA: Diagnosis not present

## 2016-06-23 DIAGNOSIS — C3490 Malignant neoplasm of unspecified part of unspecified bronchus or lung: Secondary | ICD-10-CM

## 2016-06-23 MED ORDER — ETOPOSIDE CHEMO INJECTION 1 GM/50ML
100.0000 mg/m2 | Freq: Once | INTRAVENOUS | Status: AC
  Administered 2016-06-23: 170 mg via INTRAVENOUS
  Filled 2016-06-23: qty 8.5

## 2016-06-23 MED ORDER — HEPARIN SOD (PORK) LOCK FLUSH 100 UNIT/ML IV SOLN
500.0000 [IU] | Freq: Once | INTRAVENOUS | Status: AC | PRN
  Administered 2016-06-23: 500 [IU]

## 2016-06-23 MED ORDER — POTASSIUM CHLORIDE CRYS ER 20 MEQ PO TBCR: 20.0000 meq | EXTENDED_RELEASE_TABLET | Freq: Every day | ORAL | 0 refills | Status: DC

## 2016-06-23 MED ORDER — HEPARIN SOD (PORK) LOCK FLUSH 100 UNIT/ML IV SOLN
INTRAVENOUS | Status: AC
  Filled 2016-06-23: qty 5

## 2016-06-23 MED ORDER — DEXAMETHASONE SODIUM PHOSPHATE 10 MG/ML IJ SOLN
10.0000 mg | Freq: Once | INTRAMUSCULAR | Status: AC
  Administered 2016-06-23: 10 mg via INTRAVENOUS
  Filled 2016-06-23: qty 1

## 2016-06-23 MED ORDER — SODIUM CHLORIDE 0.9 % IV SOLN
Freq: Once | INTRAVENOUS | Status: AC
  Administered 2016-06-23: 12:00:00 via INTRAVENOUS
  Filled 2016-06-23: qty 1000

## 2016-06-23 NOTE — Telephone Encounter (Signed)
Called husband and told him about the need for more potassium and she need to take 1 a day for 2 weeks and I  Sent it electronically to [facility].  He states that his feet are swollen and something wrong with them and he can't peddle the bike that distance. He states that he can walk to that pharmacy in am and get it. He will have to pay for it because we do not have an agreement with that pharmacy to pay. He is aware.

## 2016-06-23 NOTE — Telephone Encounter (Signed)
-----   Message from Sindy Guadeloupe, MD sent at 06/23/2016 12:31 PM EDT ----- Pleas have her take 20 meq potassium for 2 weeks.

## 2016-06-24 ENCOUNTER — Inpatient Hospital Stay: Payer: Medicaid Other

## 2016-06-24 ENCOUNTER — Ambulatory Visit
Admission: RE | Admit: 2016-06-24 | Discharge: 2016-06-24 | Disposition: A | Discharge status: Home / Self Care | Payer: Medicaid Other | Source: Ambulatory Visit | Attending: Radiation Oncology | Admitting: Radiation Oncology

## 2016-06-24 VITALS — BP 103/69 | HR 70 | Temp 97.0°F | Resp 20

## 2016-06-24 DIAGNOSIS — Z5111 Encounter for antineoplastic chemotherapy: Secondary | ICD-10-CM | POA: Diagnosis not present

## 2016-06-24 DIAGNOSIS — C3490 Malignant neoplasm of unspecified part of unspecified bronchus or lung: Secondary | ICD-10-CM

## 2016-06-24 DIAGNOSIS — Z510 Encounter for antineoplastic radiation therapy: Secondary | ICD-10-CM | POA: Diagnosis not present

## 2016-06-24 MED ORDER — HEPARIN SOD (PORK) LOCK FLUSH 100 UNIT/ML IV SOLN
INTRAVENOUS | Status: AC
  Filled 2016-06-24: qty 5

## 2016-06-24 MED ORDER — SODIUM CHLORIDE 0.9% FLUSH
10.0000 mL | INTRAVENOUS | Status: DC | PRN
  Administered 2016-06-24: 10 mL
  Filled 2016-06-24: qty 10

## 2016-06-24 MED ORDER — DEXAMETHASONE SODIUM PHOSPHATE 10 MG/ML IJ SOLN
10.0000 mg | Freq: Once | INTRAMUSCULAR | Status: AC
  Administered 2016-06-24: 10 mg via INTRAVENOUS
  Filled 2016-06-24: qty 1

## 2016-06-24 MED ORDER — HEPARIN SOD (PORK) LOCK FLUSH 100 UNIT/ML IV SOLN
500.0000 [IU] | Freq: Once | INTRAVENOUS | Status: AC | PRN
  Administered 2016-06-24: 500 [IU]

## 2016-06-24 MED ORDER — ETOPOSIDE CHEMO INJECTION 1 GM/50ML
100.0000 mg/m2 | Freq: Once | INTRAVENOUS | Status: AC
  Administered 2016-06-24: 170 mg via INTRAVENOUS
  Filled 2016-06-24: qty 8.5

## 2016-06-24 MED ORDER — SODIUM CHLORIDE 0.9 % IV SOLN
Freq: Once | INTRAVENOUS | Status: AC
  Administered 2016-06-24: 12:00:00 via INTRAVENOUS
  Filled 2016-06-24: qty 1000

## 2016-06-25 ENCOUNTER — Ambulatory Visit: Payer: Medicaid Other

## 2016-06-25 ENCOUNTER — Other Ambulatory Visit: Payer: Medicaid Other

## 2016-06-28 ENCOUNTER — Ambulatory Visit: Payer: Medicaid Other

## 2016-06-28 ENCOUNTER — Ambulatory Visit
Admission: RE | Admit: 2016-06-28 | Discharge: 2016-06-28 | Disposition: A | Discharge status: Home / Self Care | Payer: Medicaid Other | Source: Ambulatory Visit | Attending: Radiation Oncology | Admitting: Radiation Oncology

## 2016-06-28 DIAGNOSIS — Z510 Encounter for antineoplastic radiation therapy: Secondary | ICD-10-CM | POA: Diagnosis not present

## 2016-06-29 ENCOUNTER — Other Ambulatory Visit: Payer: Self-pay | Admitting: *Deleted

## 2016-06-29 ENCOUNTER — Ambulatory Visit: Payer: Medicaid Other

## 2016-06-29 ENCOUNTER — Ambulatory Visit: Admission: RE | Admit: 2016-06-29 | Payer: Medicaid Other | Source: Ambulatory Visit

## 2016-06-30 ENCOUNTER — Ambulatory Visit: Payer: Medicaid Other

## 2016-07-01 ENCOUNTER — Ambulatory Visit
Admission: RE | Admit: 2016-07-01 | Discharge: 2016-07-01 | Disposition: A | Discharge status: Home / Self Care | Payer: Medicaid Other | Source: Ambulatory Visit | Attending: Radiation Oncology | Admitting: Radiation Oncology

## 2016-07-01 ENCOUNTER — Ambulatory Visit: Payer: Medicaid Other

## 2016-07-01 DIAGNOSIS — Z510 Encounter for antineoplastic radiation therapy: Secondary | ICD-10-CM | POA: Diagnosis not present

## 2016-07-02 ENCOUNTER — Ambulatory Visit
Admission: RE | Admit: 2016-07-02 | Discharge: 2016-07-02 | Disposition: A | Discharge status: Home / Self Care | Payer: Medicaid Other | Source: Ambulatory Visit | Attending: Radiation Oncology | Admitting: Radiation Oncology

## 2016-07-02 DIAGNOSIS — Z510 Encounter for antineoplastic radiation therapy: Secondary | ICD-10-CM | POA: Diagnosis not present

## 2016-07-05 ENCOUNTER — Ambulatory Visit: Payer: Medicaid Other

## 2016-07-06 ENCOUNTER — Ambulatory Visit: Payer: Medicaid Other

## 2016-07-07 ENCOUNTER — Other Ambulatory Visit: Payer: Self-pay | Admitting: *Deleted

## 2016-07-07 ENCOUNTER — Ambulatory Visit: Payer: Medicaid Other

## 2016-07-07 ENCOUNTER — Ambulatory Visit
Admission: RE | Admit: 2016-07-07 | Discharge: 2016-07-07 | Disposition: A | Discharge status: Home / Self Care | Payer: Medicaid Other | Source: Ambulatory Visit | Attending: Radiation Oncology | Admitting: Radiation Oncology

## 2016-07-07 DIAGNOSIS — Z510 Encounter for antineoplastic radiation therapy: Secondary | ICD-10-CM | POA: Diagnosis not present

## 2016-07-08 ENCOUNTER — Ambulatory Visit: Payer: Medicaid Other

## 2016-07-09 ENCOUNTER — Telehealth: Payer: Self-pay | Admitting: *Deleted

## 2016-07-09 ENCOUNTER — Ambulatory Visit: Payer: Medicaid Other

## 2016-07-09 NOTE — Telephone Encounter (Signed)
Per Ranelle Oyster, RN, pt will need to taper off steroid and start taking 1 tablet daily until finished prescription. Pt scheduled for follow up next and treatment check. Pt's spouse notified and verbalized understanding.

## 2016-07-09 NOTE — Telephone Encounter (Signed)
Pt's husband called to ask if needs refill of decadron 4mg  BID that Dr. Baruch Gouty prescribed for dysphagia. Pt has only 2 more days of medication and wanted to clarify if needs refill or if needs to finish prescription. Please advise.

## 2016-07-12 ENCOUNTER — Ambulatory Visit: Payer: Medicaid Other

## 2016-07-13 ENCOUNTER — Inpatient Hospital Stay: Payer: Medicaid Other

## 2016-07-13 ENCOUNTER — Encounter: Payer: Self-pay | Admitting: Oncology

## 2016-07-13 ENCOUNTER — Inpatient Hospital Stay (HOSPITAL_BASED_OUTPATIENT_CLINIC_OR_DEPARTMENT_OTHER): Payer: Medicaid Other | Admitting: Oncology

## 2016-07-13 ENCOUNTER — Ambulatory Visit
Admission: RE | Admit: 2016-07-13 | Discharge: 2016-07-13 | Disposition: A | Discharge status: Home / Self Care | Payer: Medicaid Other | Source: Ambulatory Visit | Attending: Radiation Oncology | Admitting: Radiation Oncology

## 2016-07-13 ENCOUNTER — Ambulatory Visit: Payer: Medicaid Other

## 2016-07-13 DIAGNOSIS — R112 Nausea with vomiting, unspecified: Secondary | ICD-10-CM

## 2016-07-13 DIAGNOSIS — F419 Anxiety disorder, unspecified: Secondary | ICD-10-CM

## 2016-07-13 DIAGNOSIS — R634 Abnormal weight loss: Secondary | ICD-10-CM

## 2016-07-13 DIAGNOSIS — G40909 Epilepsy, unspecified, not intractable, without status epilepticus: Secondary | ICD-10-CM

## 2016-07-13 DIAGNOSIS — F329 Major depressive disorder, single episode, unspecified: Secondary | ICD-10-CM

## 2016-07-13 DIAGNOSIS — R918 Other nonspecific abnormal finding of lung field: Secondary | ICD-10-CM

## 2016-07-13 DIAGNOSIS — R9389 Abnormal findings on diagnostic imaging of other specified body structures: Secondary | ICD-10-CM

## 2016-07-13 DIAGNOSIS — E279 Disorder of adrenal gland, unspecified: Secondary | ICD-10-CM

## 2016-07-13 DIAGNOSIS — C3490 Malignant neoplasm of unspecified part of unspecified bronchus or lung: Secondary | ICD-10-CM

## 2016-07-13 DIAGNOSIS — D6481 Anemia due to antineoplastic chemotherapy: Secondary | ICD-10-CM

## 2016-07-13 DIAGNOSIS — F4310 Post-traumatic stress disorder, unspecified: Secondary | ICD-10-CM

## 2016-07-13 DIAGNOSIS — Z79899 Other long term (current) drug therapy: Secondary | ICD-10-CM

## 2016-07-13 DIAGNOSIS — F17210 Nicotine dependence, cigarettes, uncomplicated: Secondary | ICD-10-CM

## 2016-07-13 DIAGNOSIS — C3412 Malignant neoplasm of upper lobe, left bronchus or lung: Secondary | ICD-10-CM

## 2016-07-13 DIAGNOSIS — E876 Hypokalemia: Secondary | ICD-10-CM | POA: Diagnosis not present

## 2016-07-13 DIAGNOSIS — R0789 Other chest pain: Secondary | ICD-10-CM

## 2016-07-13 DIAGNOSIS — Z510 Encounter for antineoplastic radiation therapy: Secondary | ICD-10-CM | POA: Diagnosis not present

## 2016-07-13 DIAGNOSIS — Z5111 Encounter for antineoplastic chemotherapy: Secondary | ICD-10-CM | POA: Diagnosis not present

## 2016-07-13 DIAGNOSIS — C3492 Malignant neoplasm of unspecified part of left bronchus or lung: Secondary | ICD-10-CM

## 2016-07-13 DIAGNOSIS — G893 Neoplasm related pain (acute) (chronic): Secondary | ICD-10-CM

## 2016-07-13 DIAGNOSIS — D701 Agranulocytosis secondary to cancer chemotherapy: Secondary | ICD-10-CM

## 2016-07-13 DIAGNOSIS — T451X5A Adverse effect of antineoplastic and immunosuppressive drugs, initial encounter: Secondary | ICD-10-CM

## 2016-07-13 DIAGNOSIS — R590 Localized enlarged lymph nodes: Secondary | ICD-10-CM

## 2016-07-13 DIAGNOSIS — Z8669 Personal history of other diseases of the nervous system and sense organs: Secondary | ICD-10-CM

## 2016-07-13 LAB — CBC WITH DIFFERENTIAL/PLATELET
Basophils Absolute: 0 10*3/uL (ref 0–0.1)
Basophils Relative: 1 %
EOS ABS: 0 10*3/uL (ref 0–0.7)
Eosinophils Relative: 0 %
HCT: 28.4 % — ABNORMAL LOW (ref 35.0–47.0)
HEMOGLOBIN: 10.2 g/dL — AB (ref 12.0–16.0)
LYMPHS ABS: 0.5 10*3/uL — AB (ref 1.0–3.6)
Lymphocytes Relative: 10 %
MCH: 35.7 pg — AB (ref 26.0–34.0)
MCHC: 35.9 g/dL (ref 32.0–36.0)
MCV: 99.5 fL (ref 80.0–100.0)
MONO ABS: 0.6 10*3/uL (ref 0.2–0.9)
MONOS PCT: 12 %
NEUTROS PCT: 77 %
Neutro Abs: 3.6 10*3/uL (ref 1.4–6.5)
Platelets: 268 10*3/uL (ref 150–440)
RBC: 2.85 MIL/uL — ABNORMAL LOW (ref 3.80–5.20)
RDW: 18.5 % — AB (ref 11.5–14.5)
WBC: 4.7 10*3/uL (ref 3.6–11.0)

## 2016-07-13 LAB — COMPREHENSIVE METABOLIC PANEL
ALK PHOS: 40 U/L (ref 38–126)
ALT: 12 U/L — ABNORMAL LOW (ref 14–54)
ANION GAP: 7 (ref 5–15)
AST: 18 U/L (ref 15–41)
Albumin: 3.5 g/dL (ref 3.5–5.0)
BILIRUBIN TOTAL: 0.5 mg/dL (ref 0.3–1.2)
BUN: 19 mg/dL (ref 6–20)
CALCIUM: 8.5 mg/dL — AB (ref 8.9–10.3)
CO2: 25 mmol/L (ref 22–32)
Chloride: 105 mmol/L (ref 101–111)
Creatinine, Ser: 1.04 mg/dL — ABNORMAL HIGH (ref 0.44–1.00)
GFR calc non Af Amer: >60 mL/min (ref >60)
Glucose, Bld: 115 mg/dL — ABNORMAL HIGH (ref 65–99)
Potassium: 3.2 mmol/L — ABNORMAL LOW (ref 3.5–5.1)
SODIUM: 137 mmol/L (ref 135–145)
TOTAL PROTEIN: 6.6 g/dL (ref 6.5–8.1)

## 2016-07-13 MED ORDER — HEPARIN SOD (PORK) LOCK FLUSH 100 UNIT/ML IV SOLN
500.0000 [IU] | Freq: Once | INTRAVENOUS | Status: AC
  Administered 2016-07-13: 500 [IU] via INTRAVENOUS

## 2016-07-13 MED ORDER — POTASSIUM CHLORIDE CRYS ER 20 MEQ PO TBCR: 20.0000 meq | EXTENDED_RELEASE_TABLET | Freq: Two times a day (BID) | ORAL | 0 refills | Status: AC

## 2016-07-13 MED ORDER — SODIUM CHLORIDE 0.9% FLUSH
10.0000 mL | Freq: Once | INTRAVENOUS | Status: AC
  Administered 2016-07-13: 10 mL via INTRAVENOUS
  Filled 2016-07-13: qty 10

## 2016-07-13 MED ORDER — QUETIAPINE FUMARATE 25 MG PO TABS: 25.0000 mg | ORAL_TABLET | Freq: Every day | ORAL | 1 refills | Status: DC

## 2016-07-13 MED ORDER — OXYCODONE HCL 10 MG PO TABS: 10.0000 mg | ORAL_TABLET | Freq: Three times a day (TID) | ORAL | 0 refills | Status: DC | PRN

## 2016-07-13 NOTE — Progress Notes (Signed)
Patient states she feels tired and weak.  Also SOB.  She presents today stating she has infection on the back of her head, in both ears and on her abdomen.  I do see a scabbed over area on the back of her head and abdomen, but cannot see anything in her ears.  She is asking for refills for seroquel and oxycodone.  BP 96/60 HR 82 (sitting)  BP 85/56 HR 84 (Standing).  Patient states she cannot get her chemo today because she feels too bad.
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
## 2016-07-14 ENCOUNTER — Ambulatory Visit: Payer: Medicaid Other

## 2016-07-14 ENCOUNTER — Inpatient Hospital Stay: Payer: Medicaid Other

## 2016-07-15 ENCOUNTER — Other Ambulatory Visit: Payer: Self-pay | Admitting: *Deleted

## 2016-07-15 ENCOUNTER — Inpatient Hospital Stay: Payer: Medicaid Other

## 2016-07-15 ENCOUNTER — Ambulatory Visit
Admission: RE | Admit: 2016-07-15 | Discharge: 2016-07-15 | Disposition: A | Discharge status: Home / Self Care | Payer: Medicaid Other | Source: Ambulatory Visit | Attending: Radiation Oncology | Admitting: Radiation Oncology

## 2016-07-15 ENCOUNTER — Ambulatory Visit: Payer: Medicaid Other

## 2016-07-15 DIAGNOSIS — Z510 Encounter for antineoplastic radiation therapy: Secondary | ICD-10-CM | POA: Diagnosis not present

## 2016-07-15 MED ORDER — BACITRACIN 500 UNIT/GM EX OINT
1.0000 | TOPICAL_OINTMENT | Freq: Two times a day (BID) | CUTANEOUS | 1 refills | Status: DC
Start: 2016-07-15 — End: 2016-10-15

## 2016-07-15 NOTE — Progress Notes (Signed)
Nutrition Assessment   Reason for Assessment:   Patient identified on Malnutrition Screening Report for poor appetite and weight loss  ASSESSMENT:  46 year old female with lung cancer and currently receiving chemotherapy and radiation therapy. Past medical history of migraine, seizure, anxiety, depression, cervical cancer, bipolar  Patient reports appetite is decrease due to nausea and has no teeth.  Reports that she is taking nausea medication.  Reports that she does not cook much but uses frozen meals or easy to prepare foods.  Reports for breakfast has eggs and sausage or pancakes for lunch hamburger or chicken nuggets or leftovers and for dinner has something similar to lunch.  Likes ice cream and that is soothing with radiation esophagitis.  Reports that she has not tried ensure/boost shakes   Nutrition Focused Physical Exam: deferred  Medications: carafate, compazine, zofran, pepcid, Vit D  Labs: K 3.2, creatine 1.04  Anthropometrics:   Height: 63 inches Weight: 128 lb 1.6 oz UBW: 170 lb about 4 months ago. Noted weight 3/30 138 lb 12.8 oz BMI: 22  7% weight loss in the last 3 months  Estimated Energy Needs  Kcals: 1700-2000 calories/d Protein: 70-87 g/d Fluid: 2 L/d  NUTRITION DIAGNOSIS: Inadequate food and beverage intake related to cancer and cancer related treatments as evidenced by 7% weight loss.   MALNUTRITION DIAGNOSIS: will continue to monitor   INTERVENTION:   Discussed with patient ways to increase calories and protein. Fact sheet given Discussed oral nutrition supplements and provided samples and coupons today. Discussed foods easy to chew as patient with no teeth.     MONITORING, EVALUATION, GOAL: Patient will consume adequate calories and protein to prevent further weight loss   NEXT VISIT: phone follow-up  Xzander Gilham B. Zenia Resides, [place], [place] Registered Dietitian [phone] (pager)

## 2016-07-16 ENCOUNTER — Ambulatory Visit
Admission: RE | Admit: 2016-07-16 | Discharge: 2016-07-16 | Disposition: A | Discharge status: Home / Self Care | Payer: Medicaid Other | Source: Ambulatory Visit | Attending: Radiation Oncology | Admitting: Radiation Oncology

## 2016-07-16 ENCOUNTER — Ambulatory Visit: Payer: Medicaid Other

## 2016-07-16 DIAGNOSIS — Z510 Encounter for antineoplastic radiation therapy: Secondary | ICD-10-CM | POA: Diagnosis not present

## 2016-07-19 ENCOUNTER — Ambulatory Visit: Payer: Medicaid Other

## 2016-07-19 ENCOUNTER — Ambulatory Visit
Admission: RE | Admit: 2016-07-19 | Discharge: 2016-07-19 | Disposition: A | Discharge status: Home / Self Care | Payer: Medicaid Other | Source: Ambulatory Visit | Attending: Radiation Oncology | Admitting: Radiation Oncology

## 2016-07-19 DIAGNOSIS — Z510 Encounter for antineoplastic radiation therapy: Secondary | ICD-10-CM | POA: Diagnosis not present

## 2016-07-20 ENCOUNTER — Inpatient Hospital Stay (HOSPITAL_BASED_OUTPATIENT_CLINIC_OR_DEPARTMENT_OTHER): Payer: Medicaid Other | Admitting: Oncology

## 2016-07-20 ENCOUNTER — Ambulatory Visit
Admission: RE | Admit: 2016-07-20 | Discharge: 2016-07-20 | Disposition: A | Discharge status: Home / Self Care | Payer: Medicaid Other | Source: Ambulatory Visit | Attending: Radiation Oncology | Admitting: Radiation Oncology

## 2016-07-20 ENCOUNTER — Encounter: Payer: Self-pay | Admitting: Oncology

## 2016-07-20 ENCOUNTER — Inpatient Hospital Stay: Payer: Medicaid Other | Attending: Oncology

## 2016-07-20 ENCOUNTER — Inpatient Hospital Stay: Payer: Medicaid Other

## 2016-07-20 VITALS — BP 118/77 | HR 79 | Temp 97.4°F | Resp 18 | Wt 125.4 lb

## 2016-07-20 DIAGNOSIS — D701 Agranulocytosis secondary to cancer chemotherapy: Secondary | ICD-10-CM

## 2016-07-20 DIAGNOSIS — G893 Neoplasm related pain (acute) (chronic): Secondary | ICD-10-CM

## 2016-07-20 DIAGNOSIS — R05 Cough: Secondary | ICD-10-CM | POA: Diagnosis not present

## 2016-07-20 DIAGNOSIS — E279 Disorder of adrenal gland, unspecified: Secondary | ICD-10-CM | POA: Diagnosis not present

## 2016-07-20 DIAGNOSIS — C3492 Malignant neoplasm of unspecified part of left bronchus or lung: Secondary | ICD-10-CM

## 2016-07-20 DIAGNOSIS — Z510 Encounter for antineoplastic radiation therapy: Secondary | ICD-10-CM | POA: Diagnosis not present

## 2016-07-20 DIAGNOSIS — R112 Nausea with vomiting, unspecified: Secondary | ICD-10-CM

## 2016-07-20 DIAGNOSIS — R590 Localized enlarged lymph nodes: Secondary | ICD-10-CM

## 2016-07-20 DIAGNOSIS — Z5111 Encounter for antineoplastic chemotherapy: Secondary | ICD-10-CM | POA: Insufficient documentation

## 2016-07-20 DIAGNOSIS — T451X5A Adverse effect of antineoplastic and immunosuppressive drugs, initial encounter: Secondary | ICD-10-CM

## 2016-07-20 DIAGNOSIS — R079 Chest pain, unspecified: Secondary | ICD-10-CM | POA: Diagnosis not present

## 2016-07-20 DIAGNOSIS — C3411 Malignant neoplasm of upper lobe, right bronchus or lung: Secondary | ICD-10-CM | POA: Insufficient documentation

## 2016-07-20 DIAGNOSIS — F17210 Nicotine dependence, cigarettes, uncomplicated: Secondary | ICD-10-CM | POA: Insufficient documentation

## 2016-07-20 DIAGNOSIS — F419 Anxiety disorder, unspecified: Secondary | ICD-10-CM | POA: Insufficient documentation

## 2016-07-20 DIAGNOSIS — F329 Major depressive disorder, single episode, unspecified: Secondary | ICD-10-CM | POA: Diagnosis not present

## 2016-07-20 DIAGNOSIS — Z79899 Other long term (current) drug therapy: Secondary | ICD-10-CM | POA: Diagnosis not present

## 2016-07-20 DIAGNOSIS — R634 Abnormal weight loss: Secondary | ICD-10-CM | POA: Diagnosis not present

## 2016-07-20 DIAGNOSIS — D6959 Other secondary thrombocytopenia: Secondary | ICD-10-CM | POA: Insufficient documentation

## 2016-07-20 DIAGNOSIS — R630 Anorexia: Secondary | ICD-10-CM | POA: Diagnosis not present

## 2016-07-20 DIAGNOSIS — M25511 Pain in right shoulder: Secondary | ICD-10-CM | POA: Insufficient documentation

## 2016-07-20 DIAGNOSIS — G40909 Epilepsy, unspecified, not intractable, without status epilepticus: Secondary | ICD-10-CM | POA: Insufficient documentation

## 2016-07-20 DIAGNOSIS — L989 Disorder of the skin and subcutaneous tissue, unspecified: Secondary | ICD-10-CM | POA: Diagnosis not present

## 2016-07-20 DIAGNOSIS — D6481 Anemia due to antineoplastic chemotherapy: Secondary | ICD-10-CM | POA: Insufficient documentation

## 2016-07-20 DIAGNOSIS — R5383 Other fatigue: Secondary | ICD-10-CM | POA: Insufficient documentation

## 2016-07-20 LAB — CBC WITH DIFFERENTIAL/PLATELET
Basophils Absolute: 0 10*3/uL (ref 0–0.1)
Basophils Relative: 1 %
Eosinophils Absolute: 0 10*3/uL (ref 0–0.7)
Eosinophils Relative: 0 %
HCT: 30.2 % — ABNORMAL LOW (ref 35.0–47.0)
Hemoglobin: 10.7 g/dL — ABNORMAL LOW (ref 12.0–16.0)
LYMPHS ABS: 0.5 10*3/uL — AB (ref 1.0–3.6)
LYMPHS PCT: 12 %
MCH: 35.2 pg — AB (ref 26.0–34.0)
MCHC: 35.4 g/dL (ref 32.0–36.0)
MCV: 99.5 fL (ref 80.0–100.0)
MONO ABS: 0.8 10*3/uL (ref 0.2–0.9)
Monocytes Relative: 20 %
Neutro Abs: 2.8 10*3/uL (ref 1.4–6.5)
Neutrophils Relative %: 67 %
PLATELETS: 265 10*3/uL (ref 150–440)
RBC: 3.03 MIL/uL — ABNORMAL LOW (ref 3.80–5.20)
RDW: 18.2 % — AB (ref 11.5–14.5)
WBC: 4.2 10*3/uL (ref 3.6–11.0)

## 2016-07-20 LAB — IRON AND TIBC
Iron: 42 ug/dL (ref 28–170)
Saturation Ratios: 20 % (ref 10.4–31.8)
TIBC: 208 ug/dL — ABNORMAL LOW (ref 250–450)
UIBC: 167 ug/dL

## 2016-07-20 LAB — COMPREHENSIVE METABOLIC PANEL
ALT: 11 U/L — ABNORMAL LOW (ref 14–54)
AST: 13 U/L — ABNORMAL LOW (ref 15–41)
Albumin: 3.4 g/dL — ABNORMAL LOW (ref 3.5–5.0)
Alkaline Phosphatase: 44 U/L (ref 38–126)
Anion gap: 6 (ref 5–15)
BUN: 14 mg/dL (ref 6–20)
CHLORIDE: 105 mmol/L (ref 101–111)
CO2: 27 mmol/L (ref 22–32)
Calcium: 9 mg/dL (ref 8.9–10.3)
Creatinine, Ser: 0.88 mg/dL (ref 0.44–1.00)
GFR calc non Af Amer: >60 mL/min (ref >60)
Glucose, Bld: 80 mg/dL (ref 65–99)
POTASSIUM: 4 mmol/L (ref 3.5–5.1)
Sodium: 138 mmol/L (ref 135–145)
Total Bilirubin: 0.2 mg/dL — ABNORMAL LOW (ref 0.3–1.2)
Total Protein: 6.6 g/dL (ref 6.5–8.1)

## 2016-07-20 MED ORDER — SODIUM CHLORIDE 0.9% FLUSH
10.0000 mL | INTRAVENOUS | Status: DC | PRN
  Administered 2016-07-20: 10 mL via INTRAVENOUS
  Filled 2016-07-20: qty 10

## 2016-07-20 MED ORDER — LORAZEPAM 0.5 MG PO TABS: ORAL_TABLET | ORAL | 0 refills | Status: DC

## 2016-07-20 MED ORDER — HEPARIN SOD (PORK) LOCK FLUSH 100 UNIT/ML IV SOLN
500.0000 [IU] | Freq: Once | INTRAVENOUS | Status: AC
  Administered 2016-07-20: 500 [IU] via INTRAVENOUS

## 2016-07-20 MED ORDER — DOXYCYCLINE HYCLATE 100 MG PO TABS: 100.0000 mg | ORAL_TABLET | Freq: Two times a day (BID) | ORAL | 0 refills | Status: DC
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
## 2016-07-20 NOTE — Progress Notes (Signed)
Patient acute add on regarding right arm pain from her shoulder down.  Pain started yesterday.  Patient states it is 10/10.

## 2016-07-27 ENCOUNTER — Inpatient Hospital Stay (HOSPITAL_BASED_OUTPATIENT_CLINIC_OR_DEPARTMENT_OTHER): Payer: Medicaid Other | Admitting: Oncology

## 2016-07-27 ENCOUNTER — Encounter: Payer: Self-pay | Admitting: Oncology

## 2016-07-27 ENCOUNTER — Inpatient Hospital Stay: Payer: Medicaid Other

## 2016-07-27 VITALS — BP 101/74 | HR 85 | Temp 97.7°F | Resp 18 | Wt 123.5 lb

## 2016-07-27 DIAGNOSIS — F17210 Nicotine dependence, cigarettes, uncomplicated: Secondary | ICD-10-CM | POA: Diagnosis not present

## 2016-07-27 DIAGNOSIS — C3411 Malignant neoplasm of upper lobe, right bronchus or lung: Secondary | ICD-10-CM

## 2016-07-27 DIAGNOSIS — F329 Major depressive disorder, single episode, unspecified: Secondary | ICD-10-CM | POA: Diagnosis not present

## 2016-07-27 DIAGNOSIS — R590 Localized enlarged lymph nodes: Secondary | ICD-10-CM | POA: Diagnosis not present

## 2016-07-27 DIAGNOSIS — G40909 Epilepsy, unspecified, not intractable, without status epilepticus: Secondary | ICD-10-CM

## 2016-07-27 DIAGNOSIS — R05 Cough: Secondary | ICD-10-CM | POA: Diagnosis not present

## 2016-07-27 DIAGNOSIS — E279 Disorder of adrenal gland, unspecified: Secondary | ICD-10-CM | POA: Diagnosis not present

## 2016-07-27 DIAGNOSIS — C3490 Malignant neoplasm of unspecified part of unspecified bronchus or lung: Secondary | ICD-10-CM

## 2016-07-27 DIAGNOSIS — R630 Anorexia: Secondary | ICD-10-CM | POA: Diagnosis not present

## 2016-07-27 DIAGNOSIS — Z79899 Other long term (current) drug therapy: Secondary | ICD-10-CM

## 2016-07-27 DIAGNOSIS — Z5111 Encounter for antineoplastic chemotherapy: Secondary | ICD-10-CM

## 2016-07-27 DIAGNOSIS — M25511 Pain in right shoulder: Secondary | ICD-10-CM | POA: Diagnosis not present

## 2016-07-27 DIAGNOSIS — L989 Disorder of the skin and subcutaneous tissue, unspecified: Secondary | ICD-10-CM | POA: Diagnosis not present

## 2016-07-27 DIAGNOSIS — R634 Abnormal weight loss: Secondary | ICD-10-CM

## 2016-07-27 DIAGNOSIS — F419 Anxiety disorder, unspecified: Secondary | ICD-10-CM | POA: Diagnosis not present

## 2016-07-27 DIAGNOSIS — R5383 Other fatigue: Secondary | ICD-10-CM

## 2016-07-27 MED ORDER — SODIUM CHLORIDE 0.9 % IV SOLN
INTRAVENOUS | Status: AC
  Filled 2016-07-27: qty 1000

## 2016-07-27 MED ORDER — FOSAPREPITANT DIMEGLUMINE INJECTION 150 MG
Freq: Once | INTRAVENOUS | Status: AC
  Administered 2016-07-27: 13:00:00 via INTRAVENOUS
  Filled 2016-07-27: qty 5

## 2016-07-27 MED ORDER — PALONOSETRON HCL INJECTION 0.25 MG/5ML
0.2500 mg | Freq: Once | INTRAVENOUS | Status: AC
  Administered 2016-07-27: 0.25 mg via INTRAVENOUS

## 2016-07-27 MED ORDER — SODIUM CHLORIDE 0.9 % IV SOLN
80.0000 mg/m2 | Freq: Once | INTRAVENOUS | Status: AC
  Administered 2016-07-27: 135 mg via INTRAVENOUS
  Filled 2016-07-27: qty 135

## 2016-07-27 MED ORDER — SODIUM CHLORIDE 0.9 % IV SOLN
Freq: Once | INTRAVENOUS | Status: AC
  Administered 2016-07-27: 10:00:00 via INTRAVENOUS
  Filled 2016-07-27: qty 1000

## 2016-07-27 MED ORDER — HEPARIN SOD (PORK) LOCK FLUSH 100 UNIT/ML IV SOLN
500.0000 [IU] | Freq: Once | INTRAVENOUS | Status: AC
  Administered 2016-07-27: 500 [IU] via INTRAVENOUS

## 2016-07-27 MED ORDER — DEXAMETHASONE SODIUM PHOSPHATE 10 MG/ML IJ SOLN: 10.0000 mg | Freq: Once | INTRAMUSCULAR | Status: DC

## 2016-07-27 MED ORDER — SODIUM CHLORIDE 0.9 % IV SOLN
100.0000 mg/m2 | Freq: Once | INTRAVENOUS | Status: AC
  Administered 2016-07-27: 170 mg via INTRAVENOUS
  Filled 2016-07-27: qty 8.5

## 2016-07-27 MED ORDER — SODIUM CHLORIDE 0.9% FLUSH
10.0000 mL | INTRAVENOUS | Status: DC | PRN
  Administered 2016-07-27: 10 mL via INTRAVENOUS
  Filled 2016-07-27: qty 10

## 2016-07-27 MED ORDER — POTASSIUM CHLORIDE 2 MEQ/ML IV SOLN
Freq: Once | INTRAVENOUS | Status: AC
  Administered 2016-07-27: 11:00:00 via INTRAVENOUS
  Filled 2016-07-27: qty 1000

## 2016-07-27 MED ORDER — STERILE WATER FOR INJECTION IJ SOLN
Freq: Once | INTRAMUSCULAR | Status: AC
  Administered 2016-07-27: 11:00:00 via INTRAVENOUS
  Filled 2016-07-27: qty 18.75

## 2016-07-27 MED ORDER — POTASSIUM CHLORIDE 2 MEQ/ML IV SOLN
Freq: Once | INTRAVENOUS | Status: DC
  Filled 2016-07-27: qty 10

## 2016-07-27 NOTE — Progress Notes (Signed)
Here for follow up. C/o nausea and vomiting 3 -4 x day x 4 days per pt.
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
## 2016-07-28 ENCOUNTER — Inpatient Hospital Stay: Payer: Medicaid Other

## 2016-07-28 MED ORDER — DEXAMETHASONE SODIUM PHOSPHATE 10 MG/ML IJ SOLN: 10.0000 mg | Freq: Once | INTRAMUSCULAR | Status: AC

## 2016-07-28 MED ORDER — ETOPOSIDE CHEMO INJECTION 1 GM/50ML
100.0000 mg/m2 | Freq: Once | INTRAVENOUS | Status: AC
  Filled 2016-07-28: qty 8.5

## 2016-07-28 MED ORDER — SODIUM CHLORIDE 0.9 % IV SOLN
Freq: Once | INTRAVENOUS | Status: AC
  Filled 2016-07-28: qty 1000

## 2016-07-29 ENCOUNTER — Inpatient Hospital Stay: Payer: Medicaid Other

## 2016-07-29 DIAGNOSIS — C3490 Malignant neoplasm of unspecified part of unspecified bronchus or lung: Secondary | ICD-10-CM

## 2016-07-29 MED ORDER — SODIUM CHLORIDE 0.9 % IV SOLN
100.0000 mg/m2 | Freq: Once | INTRAVENOUS | Status: DC
Start: 2016-07-29 — End: 2016-07-29
  Filled 2016-07-29: qty 8.5

## 2016-07-29 MED ORDER — DEXAMETHASONE SODIUM PHOSPHATE 10 MG/ML IJ SOLN: 10.0000 mg | Freq: Once | INTRAMUSCULAR | Status: DC

## 2016-07-29 MED ORDER — HEPARIN SOD (PORK) LOCK FLUSH 100 UNIT/ML IV SOLN
INTRAVENOUS | Status: AC
  Filled 2016-07-29: qty 5

## 2016-07-29 MED ORDER — HEPARIN SOD (PORK) LOCK FLUSH 100 UNIT/ML IV SOLN: 500.0000 [IU] | Freq: Once | INTRAVENOUS | Status: DC

## 2016-07-29 MED ORDER — SODIUM CHLORIDE 0.9 % IV SOLN
Freq: Once | INTRAVENOUS | Status: DC
  Filled 2016-07-29: qty 1000

## 2016-08-02 ENCOUNTER — Telehealth: Payer: Self-pay | Admitting: *Deleted

## 2016-08-02 NOTE — Telephone Encounter (Signed)
Pt's husband called to report that needed ride to pick up prep for pt's CT scan tomorrow. Spoke with Elease Etienne who approved cab to pick up pt's husband to get prep. Cab voucher faxed to [employer] taxi. Pt's husband, Jeneen Rinks, made aware that cab will pick up in appx 14mins and will take him back home after picking up prep from [facility]. Pt's husband verbalized understanding.

## 2016-08-03 ENCOUNTER — Ambulatory Visit
Admission: RE | Admit: 2016-08-03 | Discharge: 2016-08-03 | Disposition: A | Discharge status: Home / Self Care | Payer: Medicaid Other | Source: Ambulatory Visit | Attending: Oncology | Admitting: Oncology

## 2016-08-03 DIAGNOSIS — C3492 Malignant neoplasm of unspecified part of left bronchus or lung: Secondary | ICD-10-CM | POA: Insufficient documentation

## 2016-08-03 DIAGNOSIS — I313 Pericardial effusion (noninflammatory): Secondary | ICD-10-CM | POA: Insufficient documentation

## 2016-08-03 DIAGNOSIS — R112 Nausea with vomiting, unspecified: Secondary | ICD-10-CM | POA: Insufficient documentation

## 2016-08-03 DIAGNOSIS — G893 Neoplasm related pain (acute) (chronic): Secondary | ICD-10-CM | POA: Diagnosis not present

## 2016-08-03 DIAGNOSIS — J9859 Other diseases of mediastinum, not elsewhere classified: Secondary | ICD-10-CM | POA: Diagnosis not present

## 2016-08-03 DIAGNOSIS — T451X5A Adverse effect of antineoplastic and immunosuppressive drugs, initial encounter: Secondary | ICD-10-CM | POA: Diagnosis not present

## 2016-08-03 DIAGNOSIS — D701 Agranulocytosis secondary to cancer chemotherapy: Secondary | ICD-10-CM | POA: Insufficient documentation

## 2016-08-03 DIAGNOSIS — Z90710 Acquired absence of both cervix and uterus: Secondary | ICD-10-CM | POA: Insufficient documentation

## 2016-08-03 DIAGNOSIS — R911 Solitary pulmonary nodule: Secondary | ICD-10-CM | POA: Insufficient documentation

## 2016-08-03 DIAGNOSIS — J449 Chronic obstructive pulmonary disease, unspecified: Secondary | ICD-10-CM | POA: Diagnosis not present

## 2016-08-03 DIAGNOSIS — E279 Disorder of adrenal gland, unspecified: Secondary | ICD-10-CM | POA: Insufficient documentation

## 2016-08-03 MED ORDER — IOPAMIDOL (ISOVUE-300) INJECTION 61%
100.0000 mL | Freq: Once | INTRAVENOUS | Status: AC | PRN
  Administered 2016-08-03: 100 mL via INTRAVENOUS

## 2016-08-10 ENCOUNTER — Inpatient Hospital Stay (HOSPITAL_BASED_OUTPATIENT_CLINIC_OR_DEPARTMENT_OTHER): Payer: Medicaid Other | Admitting: Oncology

## 2016-08-10 ENCOUNTER — Encounter: Payer: Self-pay | Admitting: Oncology

## 2016-08-10 ENCOUNTER — Other Ambulatory Visit: Payer: Self-pay | Admitting: *Deleted

## 2016-08-10 ENCOUNTER — Inpatient Hospital Stay: Payer: Medicaid Other

## 2016-08-10 VITALS — BP 98/75 | HR 75 | Temp 97.5°F | Ht 63.0 in | Wt 123.0 lb

## 2016-08-10 DIAGNOSIS — R630 Anorexia: Secondary | ICD-10-CM

## 2016-08-10 DIAGNOSIS — T451X5A Adverse effect of antineoplastic and immunosuppressive drugs, initial encounter: Secondary | ICD-10-CM

## 2016-08-10 DIAGNOSIS — D6959 Other secondary thrombocytopenia: Secondary | ICD-10-CM

## 2016-08-10 DIAGNOSIS — C3411 Malignant neoplasm of upper lobe, right bronchus or lung: Secondary | ICD-10-CM

## 2016-08-10 DIAGNOSIS — D6481 Anemia due to antineoplastic chemotherapy: Secondary | ICD-10-CM

## 2016-08-10 DIAGNOSIS — R9389 Abnormal findings on diagnostic imaging of other specified body structures: Secondary | ICD-10-CM

## 2016-08-10 DIAGNOSIS — L989 Disorder of the skin and subcutaneous tissue, unspecified: Secondary | ICD-10-CM

## 2016-08-10 DIAGNOSIS — R634 Abnormal weight loss: Secondary | ICD-10-CM

## 2016-08-10 DIAGNOSIS — D701 Agranulocytosis secondary to cancer chemotherapy: Secondary | ICD-10-CM

## 2016-08-10 DIAGNOSIS — R590 Localized enlarged lymph nodes: Secondary | ICD-10-CM | POA: Diagnosis not present

## 2016-08-10 DIAGNOSIS — E279 Disorder of adrenal gland, unspecified: Secondary | ICD-10-CM | POA: Diagnosis not present

## 2016-08-10 DIAGNOSIS — F329 Major depressive disorder, single episode, unspecified: Secondary | ICD-10-CM

## 2016-08-10 DIAGNOSIS — R5383 Other fatigue: Secondary | ICD-10-CM

## 2016-08-10 DIAGNOSIS — C3490 Malignant neoplasm of unspecified part of unspecified bronchus or lung: Secondary | ICD-10-CM

## 2016-08-10 DIAGNOSIS — F419 Anxiety disorder, unspecified: Secondary | ICD-10-CM

## 2016-08-10 DIAGNOSIS — R05 Cough: Secondary | ICD-10-CM

## 2016-08-10 DIAGNOSIS — R112 Nausea with vomiting, unspecified: Secondary | ICD-10-CM

## 2016-08-10 DIAGNOSIS — R079 Chest pain, unspecified: Secondary | ICD-10-CM | POA: Diagnosis not present

## 2016-08-10 DIAGNOSIS — Z79899 Other long term (current) drug therapy: Secondary | ICD-10-CM

## 2016-08-10 DIAGNOSIS — M25511 Pain in right shoulder: Secondary | ICD-10-CM

## 2016-08-10 DIAGNOSIS — R918 Other nonspecific abnormal finding of lung field: Secondary | ICD-10-CM

## 2016-08-10 DIAGNOSIS — G893 Neoplasm related pain (acute) (chronic): Secondary | ICD-10-CM

## 2016-08-10 DIAGNOSIS — G40909 Epilepsy, unspecified, not intractable, without status epilepticus: Secondary | ICD-10-CM

## 2016-08-10 DIAGNOSIS — C3492 Malignant neoplasm of unspecified part of left bronchus or lung: Secondary | ICD-10-CM

## 2016-08-10 DIAGNOSIS — F17210 Nicotine dependence, cigarettes, uncomplicated: Secondary | ICD-10-CM

## 2016-08-10 LAB — CBC WITH DIFFERENTIAL/PLATELET
BASOS ABS: 0 10*3/uL (ref 0–0.1)
Basophils Relative: 1 %
Eosinophils Absolute: 0 10*3/uL (ref 0–0.7)
Eosinophils Relative: 1 %
HEMATOCRIT: 29.9 % — AB (ref 35.0–47.0)
Hemoglobin: 10.7 g/dL — ABNORMAL LOW (ref 12.0–16.0)
LYMPHS ABS: 0.4 10*3/uL — AB (ref 1.0–3.6)
LYMPHS PCT: 14 %
MCH: 36.4 pg — AB (ref 26.0–34.0)
MCHC: 35.8 g/dL (ref 32.0–36.0)
MCV: 101.6 fL — AB (ref 80.0–100.0)
MONO ABS: 0.3 10*3/uL (ref 0.2–0.9)
Monocytes Relative: 9 %
NEUTROS ABS: 2.1 10*3/uL (ref 1.4–6.5)
Neutrophils Relative %: 75 %
Platelets: 147 10*3/uL — ABNORMAL LOW (ref 150–440)
RBC: 2.94 MIL/uL — AB (ref 3.80–5.20)
RDW: 16.4 % — ABNORMAL HIGH (ref 11.5–14.5)
WBC: 2.9 10*3/uL — AB (ref 3.6–11.0)

## 2016-08-10 LAB — COMPREHENSIVE METABOLIC PANEL
ALT: 8 U/L — AB (ref 14–54)
AST: 14 U/L — AB (ref 15–41)
Albumin: 3.8 g/dL (ref 3.5–5.0)
Alkaline Phosphatase: 42 U/L (ref 38–126)
Anion gap: 6 (ref 5–15)
BILIRUBIN TOTAL: 0.3 mg/dL (ref 0.3–1.2)
BUN: 15 mg/dL (ref 6–20)
CALCIUM: 8.6 mg/dL — AB (ref 8.9–10.3)
CO2: 27 mmol/L (ref 22–32)
CREATININE: 0.87 mg/dL (ref 0.44–1.00)
Chloride: 104 mmol/L (ref 101–111)
GFR calc Af Amer: >60 mL/min (ref >60)
Glucose, Bld: 80 mg/dL (ref 65–99)
Potassium: 3.3 mmol/L — ABNORMAL LOW (ref 3.5–5.1)
Sodium: 137 mmol/L (ref 135–145)
TOTAL PROTEIN: 6.6 g/dL (ref 6.5–8.1)

## 2016-08-10 MED ORDER — OXYCODONE HCL 10 MG PO TABS: 10.0000 mg | ORAL_TABLET | Freq: Three times a day (TID) | ORAL | 0 refills | Status: DC | PRN

## 2016-08-10 MED ORDER — QUETIAPINE FUMARATE 25 MG PO TABS: 25.0000 mg | ORAL_TABLET | Freq: Every day | ORAL | 1 refills | Status: DC

## 2016-08-10 NOTE — Progress Notes (Signed)
Patient here for follow up. She has an ear ache today. She is here for results today.
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
## 2016-08-19 ENCOUNTER — Other Ambulatory Visit: Payer: Self-pay | Admitting: *Deleted

## 2016-08-19 ENCOUNTER — Telehealth: Payer: Self-pay | Admitting: *Deleted

## 2016-08-19 MED ORDER — DOXYCYCLINE HYCLATE 100 MG PO TABS: 100.0000 mg | ORAL_TABLET | Freq: Two times a day (BID) | ORAL | 0 refills | Status: DC

## 2016-08-19 NOTE — Telephone Encounter (Signed)
Patient called to report that the spots on were worse. Dr. Janese Banks refilled doxycycline and instructed patient to stay out of the sun and to wear a head covering when out side. Patient verbalized understanding.

## 2016-08-23 ENCOUNTER — Encounter: Payer: Self-pay | Admitting: Radiation Oncology

## 2016-08-23 ENCOUNTER — Ambulatory Visit
Admission: RE | Admit: 2016-08-23 | Discharge: 2016-08-23 | Disposition: A | Discharge status: Home / Self Care | Payer: Medicaid Other | Source: Ambulatory Visit | Attending: Radiation Oncology | Admitting: Radiation Oncology

## 2016-08-23 VITALS — BP 113/79 | HR 92 | Temp 98.2°F | Wt 115.3 lb

## 2016-08-23 DIAGNOSIS — Z9221 Personal history of antineoplastic chemotherapy: Secondary | ICD-10-CM | POA: Diagnosis not present

## 2016-08-23 DIAGNOSIS — F419 Anxiety disorder, unspecified: Secondary | ICD-10-CM | POA: Insufficient documentation

## 2016-08-23 DIAGNOSIS — F329 Major depressive disorder, single episode, unspecified: Secondary | ICD-10-CM | POA: Insufficient documentation

## 2016-08-23 DIAGNOSIS — C3412 Malignant neoplasm of upper lobe, left bronchus or lung: Secondary | ICD-10-CM | POA: Diagnosis not present

## 2016-08-23 DIAGNOSIS — Z510 Encounter for antineoplastic radiation therapy: Secondary | ICD-10-CM | POA: Insufficient documentation

## 2016-08-23 NOTE — Progress Notes (Signed)
Radiation Oncology Follow up Note  Name: Jasmine Young   Date:   08/23/2016 MRN:  [id number] DOB: August 17, 1970    This 46 y.o. female presents to the clinic today for evaluation for PCI in patient with known limited stage small cell lung cancer status post concurrent chemoradiation to her chest.  REFERRING PROVIDER: Center, [employer]*  HPI: patient is a 46 year old female presented in March 2018 with left upper lobe mass with a large mediastinal component which was hypermetabolic on PET CT scan. Bronchoscopy guided biopsy was positive for small cell lung carcinoma..she has significant medical issues including present time. She has known bilateral polar disorder and anxiety and depression. She was treated with cisplatin and etoposide and radiation therapy and has done wellwith recent CT scan of the chest in July showing marked decreased size in large mediastinal mass with throughout dissolve subcarinal and right hilar adenopathy. She specifically denies cough hemoptysis or chest tightness. Has been dealing with since skin infection on her posterior scalp which does seem to resolve with antibiotic therapy.  COMPLICATIONS OF TREATMENT: none  FOLLOW UP COMPLIANCE: keeps appointments   PHYSICAL EXAM:  BP 113/79   Pulse 92   Temp 98.2 F (36.8 C)   Wt 115 lb 4.8 oz (52.3 kg)   BMI 20.42 kg/m  Well-developed well-nourished patient in NAD. HEENT reveals PERLA, EOMI, discs not visualized.  Oral cavity is clear. No oral mucosal lesions are identified. Neck is clear without evidence of cervical or supraclavicular adenopathy. Lungs are clear to A&P. Cardiac examination is essentially unremarkable with regular rate and rhythm without murmur rub or thrill. Abdomen is benign with no organomegaly or masses noted. Motor sensory and DTR levels are equal and symmetric in the upper and lower extremities. Cranial nerves II through XII are grossly intact. Proprioception is intact. No peripheral adenopathy  or edema is identified. No motor or sensory levels are noted. Crude visual fields are within normal range.  RADIOLOGY RESULTS: recent CT scans are reviewed and compatible with the above-stated findings.area of residual soft tissue in the left mediastinum may be scar tissue.  PLAN: I have reviewed her CT scan and believe findings show excellent response with probable residual scar tissue in the left mediastinal region.at this time I like to go ahead with low dose PCI. Would plan on delivering 2500 cGy in 10 fractions to her whole brain. Risks and benefits of treatment including hair loss skin reaction fatigue alteration of blood counts and possible cognitive decline all were discussed in detail with the patient. She seems to comprehend my treatment plan well. I like the areas on her posterior scalp to heal little more I've set her up for CT simulation for next week and will start treatments shortly thereafter.  I would like to take this opportunity to thank you for allowing me to participate in the care of your patient..  I would like to take this opportunity to thank you for allowing me to participate in the care of your patient.Armstead Peaks., MD

## 2016-08-30 ENCOUNTER — Ambulatory Visit
Admission: RE | Admit: 2016-08-30 | Discharge: 2016-08-30 | Disposition: A | Discharge status: Home / Self Care | Payer: Medicaid Other | Source: Ambulatory Visit | Attending: Radiation Oncology | Admitting: Radiation Oncology

## 2016-08-30 DIAGNOSIS — Z510 Encounter for antineoplastic radiation therapy: Secondary | ICD-10-CM | POA: Diagnosis not present

## 2016-08-31 DIAGNOSIS — Z510 Encounter for antineoplastic radiation therapy: Secondary | ICD-10-CM | POA: Diagnosis not present

## 2016-09-06 ENCOUNTER — Ambulatory Visit: Payer: Medicaid Other

## 2016-09-06 ENCOUNTER — Other Ambulatory Visit: Payer: Self-pay | Admitting: *Deleted

## 2016-09-07 ENCOUNTER — Ambulatory Visit: Payer: Medicaid Other

## 2016-09-07 ENCOUNTER — Ambulatory Visit
Admission: RE | Admit: 2016-09-07 | Discharge: 2016-09-07 | Disposition: A | Discharge status: Home / Self Care | Payer: Medicaid Other | Source: Ambulatory Visit | Attending: Radiation Oncology | Admitting: Radiation Oncology

## 2016-09-07 DIAGNOSIS — Z510 Encounter for antineoplastic radiation therapy: Secondary | ICD-10-CM | POA: Diagnosis not present

## 2016-09-08 ENCOUNTER — Ambulatory Visit
Admission: RE | Admit: 2016-09-08 | Discharge: 2016-09-08 | Disposition: A | Discharge status: Home / Self Care | Payer: Medicaid Other | Source: Ambulatory Visit | Attending: Radiation Oncology | Admitting: Radiation Oncology

## 2016-09-08 DIAGNOSIS — Z510 Encounter for antineoplastic radiation therapy: Secondary | ICD-10-CM | POA: Diagnosis not present

## 2016-09-09 ENCOUNTER — Ambulatory Visit: Payer: Medicaid Other

## 2016-09-10 ENCOUNTER — Ambulatory Visit: Payer: Medicaid Other

## 2016-09-13 ENCOUNTER — Ambulatory Visit: Payer: Medicaid Other

## 2016-09-13 ENCOUNTER — Other Ambulatory Visit: Payer: Self-pay

## 2016-09-13 DIAGNOSIS — C3490 Malignant neoplasm of unspecified part of unspecified bronchus or lung: Secondary | ICD-10-CM

## 2016-09-14 ENCOUNTER — Inpatient Hospital Stay: Payer: Medicaid Other | Attending: Oncology | Admitting: Oncology

## 2016-09-14 ENCOUNTER — Inpatient Hospital Stay: Payer: Medicaid Other

## 2016-09-14 ENCOUNTER — Encounter: Payer: Self-pay | Admitting: Oncology

## 2016-09-14 ENCOUNTER — Ambulatory Visit
Admission: RE | Admit: 2016-09-14 | Discharge: 2016-09-14 | Disposition: A | Discharge status: Home / Self Care | Payer: Medicaid Other | Source: Ambulatory Visit | Attending: Radiation Oncology | Admitting: Radiation Oncology

## 2016-09-14 VITALS — BP 106/75 | HR 76 | Temp 96.9°F | Resp 18 | Ht 63.0 in | Wt 117.3 lb

## 2016-09-14 DIAGNOSIS — Z9221 Personal history of antineoplastic chemotherapy: Secondary | ICD-10-CM | POA: Insufficient documentation

## 2016-09-14 DIAGNOSIS — T451X5A Adverse effect of antineoplastic and immunosuppressive drugs, initial encounter: Secondary | ICD-10-CM

## 2016-09-14 DIAGNOSIS — F329 Major depressive disorder, single episode, unspecified: Secondary | ICD-10-CM | POA: Insufficient documentation

## 2016-09-14 DIAGNOSIS — R079 Chest pain, unspecified: Secondary | ICD-10-CM

## 2016-09-14 DIAGNOSIS — Z923 Personal history of irradiation: Secondary | ICD-10-CM | POA: Insufficient documentation

## 2016-09-14 DIAGNOSIS — F419 Anxiety disorder, unspecified: Secondary | ICD-10-CM

## 2016-09-14 DIAGNOSIS — D701 Agranulocytosis secondary to cancer chemotherapy: Secondary | ICD-10-CM

## 2016-09-14 DIAGNOSIS — R112 Nausea with vomiting, unspecified: Secondary | ICD-10-CM

## 2016-09-14 DIAGNOSIS — C3492 Malignant neoplasm of unspecified part of left bronchus or lung: Secondary | ICD-10-CM

## 2016-09-14 DIAGNOSIS — Z510 Encounter for antineoplastic radiation therapy: Secondary | ICD-10-CM | POA: Diagnosis not present

## 2016-09-14 DIAGNOSIS — R9389 Abnormal findings on diagnostic imaging of other specified body structures: Secondary | ICD-10-CM

## 2016-09-14 DIAGNOSIS — C3490 Malignant neoplasm of unspecified part of unspecified bronchus or lung: Secondary | ICD-10-CM

## 2016-09-14 DIAGNOSIS — G893 Neoplasm related pain (acute) (chronic): Secondary | ICD-10-CM

## 2016-09-14 DIAGNOSIS — R918 Other nonspecific abnormal finding of lung field: Secondary | ICD-10-CM

## 2016-09-14 LAB — CBC WITH DIFFERENTIAL/PLATELET
Basophils Absolute: 0 10*3/uL (ref 0–0.1)
Basophils Relative: 1 %
EOS PCT: 2 %
Eosinophils Absolute: 0.1 10*3/uL (ref 0–0.7)
HEMATOCRIT: 35.1 % (ref 35.0–47.0)
Hemoglobin: 12.1 g/dL (ref 12.0–16.0)
LYMPHS ABS: 0.6 10*3/uL — AB (ref 1.0–3.6)
LYMPHS PCT: 16 %
MCH: 35.5 pg — AB (ref 26.0–34.0)
MCHC: 34.6 g/dL (ref 32.0–36.0)
MCV: 102.6 fL — AB (ref 80.0–100.0)
MONO ABS: 0.4 10*3/uL (ref 0.2–0.9)
Monocytes Relative: 10 %
NEUTROS ABS: 2.6 10*3/uL (ref 1.4–6.5)
Neutrophils Relative %: 71 %
PLATELETS: 234 10*3/uL (ref 150–440)
RBC: 3.42 MIL/uL — ABNORMAL LOW (ref 3.80–5.20)
RDW: 13.8 % (ref 11.5–14.5)
WBC: 3.7 10*3/uL (ref 3.6–11.0)

## 2016-09-14 LAB — COMPREHENSIVE METABOLIC PANEL
ALT: 12 U/L — ABNORMAL LOW (ref 14–54)
AST: 14 U/L — ABNORMAL LOW (ref 15–41)
Albumin: 3.8 g/dL (ref 3.5–5.0)
Alkaline Phosphatase: 47 U/L (ref 38–126)
Anion gap: 7 (ref 5–15)
BILIRUBIN TOTAL: 0.3 mg/dL (ref 0.3–1.2)
BUN: 20 mg/dL (ref 6–20)
CHLORIDE: 102 mmol/L (ref 101–111)
CO2: 28 mmol/L (ref 22–32)
Calcium: 8.9 mg/dL (ref 8.9–10.3)
Creatinine, Ser: 0.99 mg/dL (ref 0.44–1.00)
Glucose, Bld: 75 mg/dL (ref 65–99)
POTASSIUM: 3.8 mmol/L (ref 3.5–5.1)
Sodium: 137 mmol/L (ref 135–145)
TOTAL PROTEIN: 7 g/dL (ref 6.5–8.1)

## 2016-09-14 MED ORDER — ONDANSETRON HCL 8 MG PO TABS: 8.0000 mg | ORAL_TABLET | Freq: Two times a day (BID) | ORAL | 1 refills | Status: AC | PRN

## 2016-09-14 MED ORDER — QUETIAPINE FUMARATE 25 MG PO TABS: 25.0000 mg | ORAL_TABLET | Freq: Every day | ORAL | 1 refills | Status: AC

## 2016-09-14 MED ORDER — PROCHLORPERAZINE MALEATE 10 MG PO TABS: 10.0000 mg | ORAL_TABLET | Freq: Four times a day (QID) | ORAL | 1 refills | Status: AC | PRN

## 2016-09-14 MED ORDER — LORAZEPAM 0.5 MG PO TABS: ORAL_TABLET | ORAL | 0 refills | Status: DC

## 2016-09-14 MED ORDER — OXYCODONE HCL 10 MG PO TABS: 10.0000 mg | ORAL_TABLET | Freq: Three times a day (TID) | ORAL | 0 refills | Status: DC | PRN
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
## 2016-09-15 ENCOUNTER — Ambulatory Visit: Payer: Medicaid Other

## 2016-09-16 ENCOUNTER — Telehealth: Payer: Self-pay | Admitting: *Deleted

## 2016-09-16 ENCOUNTER — Ambulatory Visit: Payer: Medicaid Other

## 2016-09-16 NOTE — Telephone Encounter (Signed)
Called and spoke to terry in radiology scheduling and pt does not need neck ct per Dr. Janese Banks and I called and cancelled it.

## 2016-09-17 ENCOUNTER — Ambulatory Visit: Payer: Medicaid Other

## 2016-09-21 ENCOUNTER — Ambulatory Visit: Payer: Medicaid Other

## 2016-09-22 ENCOUNTER — Ambulatory Visit: Payer: Medicaid Other

## 2016-09-23 ENCOUNTER — Ambulatory Visit: Payer: Medicaid Other

## 2016-09-24 ENCOUNTER — Ambulatory Visit: Payer: Medicaid Other

## 2016-09-25 ENCOUNTER — Ambulatory Visit: Payer: Medicaid Other

## 2016-09-27 ENCOUNTER — Ambulatory Visit: Payer: Medicaid Other

## 2016-09-28 ENCOUNTER — Ambulatory Visit: Payer: Medicaid Other

## 2016-09-29 ENCOUNTER — Ambulatory Visit: Payer: Medicaid Other

## 2016-09-30 ENCOUNTER — Ambulatory Visit: Payer: Medicaid Other

## 2016-10-01 ENCOUNTER — Ambulatory Visit: Payer: Medicaid Other

## 2016-10-04 ENCOUNTER — Ambulatory Visit: Payer: Medicaid Other

## 2016-10-05 ENCOUNTER — Ambulatory Visit: Payer: Medicaid Other

## 2016-10-06 ENCOUNTER — Ambulatory Visit: Payer: Medicaid Other

## 2016-10-07 ENCOUNTER — Ambulatory Visit: Payer: Medicaid Other

## 2016-10-08 ENCOUNTER — Ambulatory Visit: Payer: Medicaid Other

## 2016-10-15 ENCOUNTER — Inpatient Hospital Stay: Payer: Medicaid Other | Admitting: Oncology

## 2016-10-15 ENCOUNTER — Inpatient Hospital Stay: Payer: Medicaid Other | Attending: Oncology | Admitting: Oncology

## 2016-10-15 ENCOUNTER — Telehealth: Payer: Self-pay | Admitting: Oncology

## 2016-10-15 ENCOUNTER — Inpatient Hospital Stay: Payer: Medicaid Other

## 2016-10-15 DIAGNOSIS — C3412 Malignant neoplasm of upper lobe, left bronchus or lung: Secondary | ICD-10-CM | POA: Diagnosis present

## 2016-10-15 DIAGNOSIS — R110 Nausea: Secondary | ICD-10-CM | POA: Diagnosis not present

## 2016-10-15 DIAGNOSIS — F419 Anxiety disorder, unspecified: Secondary | ICD-10-CM | POA: Insufficient documentation

## 2016-10-15 DIAGNOSIS — Z79899 Other long term (current) drug therapy: Secondary | ICD-10-CM | POA: Diagnosis not present

## 2016-10-15 DIAGNOSIS — F319 Bipolar disorder, unspecified: Secondary | ICD-10-CM | POA: Insufficient documentation

## 2016-10-15 DIAGNOSIS — C3490 Malignant neoplasm of unspecified part of unspecified bronchus or lung: Secondary | ICD-10-CM

## 2016-10-15 DIAGNOSIS — F329 Major depressive disorder, single episode, unspecified: Secondary | ICD-10-CM | POA: Insufficient documentation

## 2016-10-15 DIAGNOSIS — R634 Abnormal weight loss: Secondary | ICD-10-CM | POA: Diagnosis not present

## 2016-10-15 DIAGNOSIS — R079 Chest pain, unspecified: Secondary | ICD-10-CM | POA: Diagnosis not present

## 2016-10-15 DIAGNOSIS — R599 Enlarged lymph nodes, unspecified: Secondary | ICD-10-CM | POA: Insufficient documentation

## 2016-10-15 DIAGNOSIS — E279 Disorder of adrenal gland, unspecified: Secondary | ICD-10-CM | POA: Insufficient documentation

## 2016-10-15 DIAGNOSIS — R109 Unspecified abdominal pain: Secondary | ICD-10-CM | POA: Diagnosis not present

## 2016-10-15 MED ORDER — OXYCODONE HCL 10 MG PO TABS: 10.0000 mg | ORAL_TABLET | Freq: Four times a day (QID) | ORAL | 0 refills | Status: DC | PRN

## 2016-10-15 NOTE — Telephone Encounter (Signed)
S/W Katrine Coho at [facility] Radiology Scheduling Dept to advise that in the "Appointment Question Answers" the scheduler selected that the patient is pregnant, but the patient is NOT pregnant.  Per Katrine Coho, she will update answer to NOT PREGNANT. Sherry/desk nurse advise of discrepancy.
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
## 2016-10-15 NOTE — Progress Notes (Signed)
Pt is in pain and takes 2 tylenol at a time and takes it often and she will not tell me how many times a day.  She is wheezing at night. Sometimes if she eats she can keep it down and then sometime when food gets to back of throat she will throw it up.  She states she gets sob when up and moving at times.
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
## 2016-10-22 ENCOUNTER — Encounter: Payer: Self-pay | Admitting: Oncology

## 2016-10-22 ENCOUNTER — Inpatient Hospital Stay: Payer: Medicaid Other | Attending: Oncology | Admitting: Oncology

## 2016-10-22 ENCOUNTER — Other Ambulatory Visit: Payer: Self-pay | Admitting: *Deleted

## 2016-10-22 ENCOUNTER — Telehealth: Payer: Self-pay | Admitting: *Deleted

## 2016-10-22 ENCOUNTER — Ambulatory Visit
Admission: RE | Admit: 2016-10-22 | Discharge: 2016-10-22 | Disposition: A | Discharge status: Home / Self Care | Payer: Medicaid Other | Source: Ambulatory Visit | Attending: Nurse Practitioner | Admitting: Nurse Practitioner

## 2016-10-22 VITALS — BP 98/66 | HR 80 | Temp 96.9°F | Wt 114.0 lb

## 2016-10-22 DIAGNOSIS — Z923 Personal history of irradiation: Secondary | ICD-10-CM | POA: Diagnosis not present

## 2016-10-22 DIAGNOSIS — R079 Chest pain, unspecified: Secondary | ICD-10-CM | POA: Insufficient documentation

## 2016-10-22 DIAGNOSIS — I700 Atherosclerosis of aorta: Secondary | ICD-10-CM

## 2016-10-22 DIAGNOSIS — F419 Anxiety disorder, unspecified: Secondary | ICD-10-CM | POA: Insufficient documentation

## 2016-10-22 DIAGNOSIS — E279 Disorder of adrenal gland, unspecified: Secondary | ICD-10-CM | POA: Insufficient documentation

## 2016-10-22 DIAGNOSIS — R222 Localized swelling, mass and lump, trunk: Secondary | ICD-10-CM | POA: Diagnosis not present

## 2016-10-22 DIAGNOSIS — C3412 Malignant neoplasm of upper lobe, left bronchus or lung: Secondary | ICD-10-CM | POA: Diagnosis present

## 2016-10-22 DIAGNOSIS — Z90710 Acquired absence of both cervix and uterus: Secondary | ICD-10-CM | POA: Diagnosis not present

## 2016-10-22 DIAGNOSIS — R634 Abnormal weight loss: Secondary | ICD-10-CM | POA: Diagnosis not present

## 2016-10-22 DIAGNOSIS — R918 Other nonspecific abnormal finding of lung field: Secondary | ICD-10-CM | POA: Diagnosis not present

## 2016-10-22 DIAGNOSIS — F17210 Nicotine dependence, cigarettes, uncomplicated: Secondary | ICD-10-CM | POA: Insufficient documentation

## 2016-10-22 DIAGNOSIS — R0789 Other chest pain: Secondary | ICD-10-CM

## 2016-10-22 DIAGNOSIS — Z79899 Other long term (current) drug therapy: Secondary | ICD-10-CM | POA: Insufficient documentation

## 2016-10-22 DIAGNOSIS — Z9221 Personal history of antineoplastic chemotherapy: Secondary | ICD-10-CM

## 2016-10-22 DIAGNOSIS — R590 Localized enlarged lymph nodes: Secondary | ICD-10-CM | POA: Diagnosis not present

## 2016-10-22 DIAGNOSIS — G40909 Epilepsy, unspecified, not intractable, without status epilepticus: Secondary | ICD-10-CM | POA: Diagnosis not present

## 2016-10-22 DIAGNOSIS — F329 Major depressive disorder, single episode, unspecified: Secondary | ICD-10-CM | POA: Diagnosis not present

## 2016-10-22 DIAGNOSIS — C3490 Malignant neoplasm of unspecified part of unspecified bronchus or lung: Secondary | ICD-10-CM

## 2016-10-22 DIAGNOSIS — C3492 Malignant neoplasm of unspecified part of left bronchus or lung: Secondary | ICD-10-CM | POA: Diagnosis present

## 2016-10-22 HISTORY — DX: Major depressive disorder, single episode, unspecified: F32.9

## 2016-10-22 HISTORY — DX: Depression, unspecified: F32.A

## 2016-10-22 HISTORY — DX: Anxiety disorder, unspecified: F41.9

## 2016-10-22 MED ORDER — OXYCODONE HCL 5 MG PO TABS: 5.0000 mg | ORAL_TABLET | Freq: Three times a day (TID) | ORAL | 0 refills | Status: DC | PRN

## 2016-10-22 MED ORDER — IOPAMIDOL (ISOVUE-300) INJECTION 61%
80.0000 mL | Freq: Once | INTRAVENOUS | Status: AC | PRN
  Administered 2016-10-22: 80 mL via INTRAVENOUS

## 2016-10-22 NOTE — Telephone Encounter (Signed)
Spoke with Desiree at [facility], where patient is established with Gennette Pac, MD, as her primary care provider. She was last seen there in July, 2018. I informed Desiree that patient needs appointment to follow up with her primary care MD for pain control. Dr. Janese Banks gave her a 30 day supply of the oxycodone today, but informed patient that this is the last one she will write. Desiree will fax [place] a copy of the office note from July, and will contact patient for a follow up appointment.  dhs

## 2016-10-22 NOTE — Telephone Encounter (Signed)
Need prescription for Oxycodone 5 mg every 8 hours #90 tablets

## 2016-10-22 NOTE — Progress Notes (Signed)
Patient here for follow up with CT results. She states that she is in pain all over her body at a 10 on a 0-10 pain scale. She states that the only thing that relieves her pain is Percocet, but that Dr. Janese Banks will not give her the medication. She complains of a burning esophagus.
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
## 2016-10-25 ENCOUNTER — Encounter
Admission: RE | Admit: 2016-10-25 | Discharge: 2016-10-25 | Disposition: A | Discharge status: Home / Self Care | Payer: Medicaid Other | Source: Ambulatory Visit | Attending: Oncology | Admitting: Oncology

## 2016-10-25 DIAGNOSIS — D701 Agranulocytosis secondary to cancer chemotherapy: Secondary | ICD-10-CM

## 2016-10-25 DIAGNOSIS — T451X5A Adverse effect of antineoplastic and immunosuppressive drugs, initial encounter: Secondary | ICD-10-CM

## 2016-10-25 DIAGNOSIS — R112 Nausea with vomiting, unspecified: Secondary | ICD-10-CM

## 2016-10-25 DIAGNOSIS — C3492 Malignant neoplasm of unspecified part of left bronchus or lung: Secondary | ICD-10-CM

## 2016-10-25 DIAGNOSIS — G893 Neoplasm related pain (acute) (chronic): Secondary | ICD-10-CM

## 2016-11-15 ENCOUNTER — Inpatient Hospital Stay: Payer: Medicaid Other

## 2016-11-15 ENCOUNTER — Ambulatory Visit
Admission: RE | Admit: 2016-11-15 | Discharge: 2016-11-15 | Disposition: A | Discharge status: Home / Self Care | Payer: Medicaid Other | Source: Ambulatory Visit | Attending: Nurse Practitioner | Admitting: Nurse Practitioner

## 2016-11-15 DIAGNOSIS — C3490 Malignant neoplasm of unspecified part of unspecified bronchus or lung: Secondary | ICD-10-CM

## 2016-11-18 ENCOUNTER — Inpatient Hospital Stay: Payer: Medicaid Other | Admitting: Oncology

## 2016-11-18 ENCOUNTER — Inpatient Hospital Stay: Payer: Medicaid Other

## 2017-01-21 ENCOUNTER — Inpatient Hospital Stay: Payer: Medicaid Other | Attending: Oncology

## 2017-01-27 ENCOUNTER — Other Ambulatory Visit: Payer: Self-pay | Admitting: Nurse Practitioner

## 2017-01-27 DIAGNOSIS — C3490 Malignant neoplasm of unspecified part of unspecified bronchus or lung: Secondary | ICD-10-CM

## 2017-01-28 ENCOUNTER — Ambulatory Visit: Admission: RE | Admit: 2017-01-28 | Payer: Medicaid Other | Source: Ambulatory Visit

## 2017-01-28 ENCOUNTER — Ambulatory Visit
Admission: RE | Admit: 2017-01-28 | Discharge: 2017-01-28 | Disposition: A | Discharge status: Home / Self Care | Payer: Medicaid Other | Source: Ambulatory Visit | Attending: Nurse Practitioner | Admitting: Nurse Practitioner

## 2017-01-28 DIAGNOSIS — C3490 Malignant neoplasm of unspecified part of unspecified bronchus or lung: Secondary | ICD-10-CM | POA: Diagnosis not present

## 2017-01-28 DIAGNOSIS — I313 Pericardial effusion (noninflammatory): Secondary | ICD-10-CM | POA: Diagnosis not present

## 2017-01-28 DIAGNOSIS — R911 Solitary pulmonary nodule: Secondary | ICD-10-CM | POA: Diagnosis not present

## 2017-01-28 DIAGNOSIS — I700 Atherosclerosis of aorta: Secondary | ICD-10-CM | POA: Insufficient documentation

## 2017-01-28 DIAGNOSIS — J439 Emphysema, unspecified: Secondary | ICD-10-CM | POA: Insufficient documentation

## 2017-01-28 DIAGNOSIS — M5126 Other intervertebral disc displacement, lumbar region: Secondary | ICD-10-CM | POA: Insufficient documentation

## 2017-01-28 DIAGNOSIS — M5124 Other intervertebral disc displacement, thoracic region: Secondary | ICD-10-CM | POA: Diagnosis not present

## 2017-01-28 MED ORDER — IOPAMIDOL (ISOVUE-300) INJECTION 61%
100.0000 mL | Freq: Once | INTRAVENOUS | Status: AC | PRN
  Administered 2017-01-28: 100 mL via INTRAVENOUS

## 2017-02-04 ENCOUNTER — Inpatient Hospital Stay: Payer: Medicaid Other

## 2017-02-04 ENCOUNTER — Inpatient Hospital Stay: Payer: Medicaid Other | Attending: Oncology | Admitting: Oncology

## 2017-02-04 VITALS — BP 95/62 | Temp 98.3°F | Resp 18 | Wt 122.8 lb

## 2017-02-04 DIAGNOSIS — R599 Enlarged lymph nodes, unspecified: Secondary | ICD-10-CM | POA: Insufficient documentation

## 2017-02-04 DIAGNOSIS — F17210 Nicotine dependence, cigarettes, uncomplicated: Secondary | ICD-10-CM | POA: Insufficient documentation

## 2017-02-04 DIAGNOSIS — F329 Major depressive disorder, single episode, unspecified: Secondary | ICD-10-CM | POA: Diagnosis not present

## 2017-02-04 DIAGNOSIS — M5126 Other intervertebral disc displacement, lumbar region: Secondary | ICD-10-CM | POA: Diagnosis not present

## 2017-02-04 DIAGNOSIS — G40909 Epilepsy, unspecified, not intractable, without status epilepticus: Secondary | ICD-10-CM | POA: Diagnosis not present

## 2017-02-04 DIAGNOSIS — Z08 Encounter for follow-up examination after completed treatment for malignant neoplasm: Secondary | ICD-10-CM

## 2017-02-04 DIAGNOSIS — Z9049 Acquired absence of other specified parts of digestive tract: Secondary | ICD-10-CM | POA: Insufficient documentation

## 2017-02-04 DIAGNOSIS — Z90710 Acquired absence of both cervix and uterus: Secondary | ICD-10-CM | POA: Insufficient documentation

## 2017-02-04 DIAGNOSIS — M5124 Other intervertebral disc displacement, thoracic region: Secondary | ICD-10-CM | POA: Diagnosis not present

## 2017-02-04 DIAGNOSIS — R634 Abnormal weight loss: Secondary | ICD-10-CM | POA: Diagnosis not present

## 2017-02-04 DIAGNOSIS — Z85118 Personal history of other malignant neoplasm of bronchus and lung: Secondary | ICD-10-CM

## 2017-02-04 DIAGNOSIS — R918 Other nonspecific abnormal finding of lung field: Secondary | ICD-10-CM | POA: Insufficient documentation

## 2017-02-04 DIAGNOSIS — F419 Anxiety disorder, unspecified: Secondary | ICD-10-CM | POA: Insufficient documentation

## 2017-02-04 DIAGNOSIS — C3412 Malignant neoplasm of upper lobe, left bronchus or lung: Secondary | ICD-10-CM | POA: Diagnosis not present

## 2017-02-04 DIAGNOSIS — Z8541 Personal history of malignant neoplasm of cervix uteri: Secondary | ICD-10-CM | POA: Insufficient documentation

## 2017-02-04 DIAGNOSIS — F319 Bipolar disorder, unspecified: Secondary | ICD-10-CM | POA: Diagnosis not present

## 2017-02-04 DIAGNOSIS — E279 Disorder of adrenal gland, unspecified: Secondary | ICD-10-CM | POA: Diagnosis not present

## 2017-02-04 DIAGNOSIS — C3490 Malignant neoplasm of unspecified part of unspecified bronchus or lung: Secondary | ICD-10-CM

## 2017-02-04 DIAGNOSIS — Z79899 Other long term (current) drug therapy: Secondary | ICD-10-CM | POA: Diagnosis not present

## 2017-02-04 DIAGNOSIS — J984 Other disorders of lung: Secondary | ICD-10-CM

## 2017-02-04 LAB — COMPREHENSIVE METABOLIC PANEL
ALBUMIN: 3.6 g/dL (ref 3.5–5.0)
ALT: 12 U/L — ABNORMAL LOW (ref 14–54)
AST: 15 U/L (ref 15–41)
Alkaline Phosphatase: 77 U/L (ref 38–126)
Anion gap: 6 (ref 5–15)
BILIRUBIN TOTAL: 0.2 mg/dL — AB (ref 0.3–1.2)
BUN: 16 mg/dL (ref 6–20)
CO2: 29 mmol/L (ref 22–32)
Calcium: 8.9 mg/dL (ref 8.9–10.3)
Chloride: 101 mmol/L (ref 101–111)
Creatinine, Ser: 0.89 mg/dL (ref 0.44–1.00)
GFR calc Af Amer: >60 mL/min (ref >60)
GFR calc non Af Amer: >60 mL/min (ref >60)
GLUCOSE: 90 mg/dL (ref 65–99)
POTASSIUM: 4.4 mmol/L (ref 3.5–5.1)
Sodium: 136 mmol/L (ref 135–145)
TOTAL PROTEIN: 7 g/dL (ref 6.5–8.1)

## 2017-02-04 LAB — CBC WITH DIFFERENTIAL/PLATELET
BASOS ABS: 0 10*3/uL (ref 0–0.1)
BASOS PCT: 1 %
Eosinophils Absolute: 0.1 10*3/uL (ref 0–0.7)
Eosinophils Relative: 2 %
HEMATOCRIT: 38.8 % (ref 35.0–47.0)
HEMOGLOBIN: 13.1 g/dL (ref 12.0–16.0)
Lymphocytes Relative: 24 %
Lymphs Abs: 0.7 10*3/uL — ABNORMAL LOW (ref 1.0–3.6)
MCH: 33.5 pg (ref 26.0–34.0)
MCHC: 33.7 g/dL (ref 32.0–36.0)
MCV: 99.4 fL (ref 80.0–100.0)
MONO ABS: 0.4 10*3/uL (ref 0.2–0.9)
Monocytes Relative: 13 %
NEUTROS ABS: 1.7 10*3/uL (ref 1.4–6.5)
NEUTROS PCT: 60 %
Platelets: 217 10*3/uL (ref 150–440)
RBC: 3.9 MIL/uL (ref 3.80–5.20)
RDW: 14 % (ref 11.5–14.5)
WBC: 2.9 10*3/uL — ABNORMAL LOW (ref 3.6–11.0)

## 2017-02-04 NOTE — Progress Notes (Signed)
Patient is here for follow up and she is asking for her pain meds.
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
## 2017-02-07 ENCOUNTER — Encounter: Payer: Self-pay | Admitting: Oncology

## 2017-02-07 ENCOUNTER — Telehealth: Payer: Self-pay | Admitting: *Deleted

## 2017-02-07 NOTE — Telephone Encounter (Signed)
Pt's husband left a message to call back regarding appt scheduled for patient. Attempted to call pt's husband with no answer and unable to leave message. Will try again tomorrow.

## 2017-02-08 NOTE — Telephone Encounter (Signed)
Called pt's husband back today (02/08/17) with no answer and unable to leave message.

## 2017-02-09 ENCOUNTER — Institutional Professional Consult (permissible substitution): Payer: Medicaid Other | Admitting: Internal Medicine

## 2017-02-11 NOTE — Progress Notes (Signed)
PULMONARY CONSULT NOTE  Requesting MD/Service: Janese Banks Date of initial consultation: 02/11/2017 Reason for consultation: COPD  PT PROFILE: 47 y.o. female smoker with SCLC.   HPI:  The patient underwent bronchoscopy 04/20/16 by Dr. Alva Garnet which showed [place]. She was noted to have persistent chest wall pain and dyspnea, she is referred back to pulmonary for further treatment of COPD.  She is currently not on any inhalers. She is not on oxygen.  She has reflux with symptoms 4 or 5 times per week. Her breathing is bad most of the day, she has trouble with walking around the home, she lives in a boarding room/house.  She has trouble with doing house work.  She is smoking less than a ppd, previously 2 ppd.   She has not quit before, she is asking for nicotine patches.   CT chest 01/28/17; minimally changed from previous. Emphysematous changes, LUL scarring and bronchiectasis.    MEDICATIONS: I have reviewed all medications and confirmed regimen as documented  Social History   Socioeconomic History  . Marital status: Married    Spouse name: Not on file  . Number of children: Not on file  . Years of education: Not on file  . Highest education level: Not on file  Social Needs  . Financial resource strain: Not on file  . Food insecurity - worry: Not on file  . Food insecurity - inability: Not on file  . Transportation needs - medical: Not on file  . Transportation needs - non-medical: Not on file  Occupational History  . Not on file  Tobacco Use  . Smoking status: Current Every Day Smoker    Packs/day: 0.50    Years: 30.00    Pack years: 15.00    Types: Cigarettes  . Smokeless tobacco: Never Used  Substance and Sexual Activity  . Alcohol use: Yes    Comment: occasional  . Drug use: No  . Sexual activity: Not on file  Other Topics Concern  . Not on file  Social History Narrative  . Not on file    No family history on file.  ROS: No fever, chills sweats No new focal weakness  or sensory deficits No otalgia, hearing loss, visual changes, nasal and sinus symptoms, mouth and throat problems No neck pain or adenopathy No abdominal pain, N/V/D, diarrhea, change in bowel pattern No dysuria, change in urinary pattern   Vitals:   02/14/17 1535  BP: 102/68  Pulse: 68  SpO2: 97%  Weight: 126 lb 9.6 oz (57.4 kg)  Height: 5\' 3"  (1.6 m)   RA  EXAM:  Gen: WDWN, No overt respiratory distress HEENT: NCAT, sclera white, oropharynx normal Neck: Supple without LAN, thyromegaly, JVD Lungs: breath sounds coarse throughout without true wheezes or other adventitious sounds Cardiovascular: Reg, no murmurs noted Abdomen: Soft, nontender, normal BS Ext: without clubbing, cyanosis, edema Neuro: CNs grossly intact, motor and sensory intact Skin: Limited exam, no lesions noted  DATA:   BMP Latest Ref Rng & Units 02/04/2017 09/14/2016 08/10/2016  Glucose 65 - 99 mg/dL 90 75 80  BUN 6 - 20 mg/dL 16 20 15   Creatinine 0.44 - 1.00 mg/dL 0.89 0.99 0.87  Sodium 135 - 145 mmol/L 136 137 137  Potassium 3.5 - 5.1 mmol/L 4.4 3.8 3.3(L)  Chloride 101 - 111 mmol/L 101 102 104  CO2 22 - 32 mmol/L 29 28 27   Calcium 8.9 - 10.3 mg/dL 8.9 8.9 8.6(L)    CBC Latest Ref Rng & Units 02/04/2017 09/14/2016 08/10/2016  WBC 3.6 - 11.0 K/uL 2.9(L) 3.7 2.9(L)  Hemoglobin 12.0 - 16.0 g/dL 13.1 12.1 10.7(L)  Hematocrit 35.0 - 47.0 % 38.8 35.1 29.9(L)  Platelets 150 - 440 K/uL 217 234 147(L)    CXR 03/08/17: Left upper lobe, paratracheal opacity CT chest 04/06/16: 8 cm left upper lobe paratracheal mass with a small apical satellite lesion PET scan 03/3016: The above masses light up on PET scan. No evidence of adenopathy or distant metastases  IMPRESSION:   COPD/emphysema, chronic exertional dyspnea. Small cell lung cancer-limited stage status post chemo radiation, prophylactic cranial radiation, currently in remission and following with oncology. Nicotine abuse.  PLAN:  Discussed the importance  of smoking cessation, spent greater than 3 minutes in discussion. We will start the patient on Advair Diskus 250, 1 puff twice daily, asked to rinse mouth after use.  She is also given a prescription for a albuterol rescue inhaler. We discussed smoking cessation as above, she was given a prescription for nicotine patches.  Meds ordered this encounter  Medications  . Fluticasone-Salmeterol (ADVAIR) 250-50 MCG/DOSE AEPB    Sig: Inhale 1 puff into the lungs 2 (two) times daily.    Dispense:  30 each    Refill:  5  . albuterol (PROVENTIL HFA;VENTOLIN HFA) 108 (90 Base) MCG/ACT inhaler    Sig: Inhale 2 puffs into the lungs every 6 (six) hours as needed for wheezing or shortness of breath.    Dispense:  1 Inhaler    Refill:  2  . nicotine (NICODERM CQ - DOSED IN MG/24 HOURS) 21 mg/24hr patch    Sig: Place 1 patch (21 mg total) onto the skin daily.    Dispense:  28 patch    Refill:  0   Return in about 3 months (around 05/15/2017).   02/11/2017

## 2017-02-14 ENCOUNTER — Encounter: Payer: Self-pay | Admitting: Internal Medicine

## 2017-02-14 ENCOUNTER — Ambulatory Visit (INDEPENDENT_AMBULATORY_CARE_PROVIDER_SITE_OTHER): Payer: Medicaid Other | Admitting: Internal Medicine

## 2017-02-14 VITALS — BP 102/68 | HR 68 | Ht 63.0 in | Wt 126.6 lb

## 2017-02-14 DIAGNOSIS — F17200 Nicotine dependence, unspecified, uncomplicated: Secondary | ICD-10-CM

## 2017-02-14 DIAGNOSIS — J449 Chronic obstructive pulmonary disease, unspecified: Secondary | ICD-10-CM

## 2017-02-14 DIAGNOSIS — J439 Emphysema, unspecified: Secondary | ICD-10-CM

## 2017-02-14 MED ORDER — NICOTINE 21 MG/24HR TD PT24: 21.0000 mg | MEDICATED_PATCH | Freq: Every day | TRANSDERMAL | 0 refills | Status: AC

## 2017-02-14 MED ORDER — FLUTICASONE-SALMETEROL 250-50 MCG/DOSE IN AEPB: 1.0000 | INHALATION_SPRAY | Freq: Two times a day (BID) | RESPIRATORY_TRACT | 5 refills | Status: AC

## 2017-02-14 MED ORDER — ALBUTEROL SULFATE HFA 108 (90 BASE) MCG/ACT IN AERS: 2.0000 | INHALATION_SPRAY | Freq: Four times a day (QID) | RESPIRATORY_TRACT | 2 refills | Status: AC | PRN

## 2017-02-14 NOTE — Patient Instructions (Addendum)
   Start advair twice daily, rinse mouth after use.   --Quitting smoking is the most important thing that you can do for your health.  --Quitting smoking will have greater affect on your health than any medicine that we can give you.   --The best way to quit is to set a quit date, usually a day that has meaning like someone's birthday.  --Start any medication prescribed for quitting one week before you quit date. Then toss out the cigarettes on your quit date.  --If you start smoking again, start from scratch--set another quit day and try again!

## 2017-02-21 ENCOUNTER — Telehealth: Payer: Self-pay | Admitting: *Deleted

## 2017-02-21 NOTE — Telephone Encounter (Signed)
PA for Advair  Medicaid has denied Advair. Form in provider folder for signature. Will fax and await approval.

## 2017-03-01 ENCOUNTER — Telehealth: Payer: Self-pay | Admitting: *Deleted

## 2017-03-01 NOTE — Telephone Encounter (Signed)
PA [phone] Valid thru 02/16/18

## 2017-04-05 ENCOUNTER — Ambulatory Visit: Payer: Medicaid Other

## 2017-04-05 ENCOUNTER — Other Ambulatory Visit: Payer: Self-pay | Admitting: Oncology

## 2017-04-30 ENCOUNTER — Other Ambulatory Visit: Payer: Self-pay | Admitting: Nurse Practitioner

## 2017-05-05 ENCOUNTER — Ambulatory Visit: Admission: RE | Admit: 2017-05-05 | Payer: Medicaid Other | Source: Ambulatory Visit

## 2017-05-05 ENCOUNTER — Other Ambulatory Visit: Payer: Medicaid Other

## 2017-05-06 ENCOUNTER — Inpatient Hospital Stay: Payer: Medicaid Other | Admitting: Oncology

## 2017-05-06 ENCOUNTER — Inpatient Hospital Stay: Payer: Medicaid Other

## 2018-10-13 NOTE — Telephone Encounter (Signed)
Opened in error

## 2018-11-24 IMAGING — CT CT CHEST W/ CM
1 series · 15 of 34 positions shown, 19 images · IV contrast (iopamidol)
Comparison: 03/08/2016

CLINICAL DATA: Follow-up chest x-ray AA, possible left lung mass,
initial encounter

EXAM:
CT CHEST WITH CONTRAST
TECHNIQUE: Multidetector CT imaging of the chest was performed during
intravenous contrast administration.
CONTRAST:  75mL [id number] IOPAMIDOL ([id number]) INJECTION 61%

[Series 5: axial st. · axial · 0.63mm/px · z∈[-512,-264]mm · 15 of 146 slices shown, 19 images]
[im 11/146  mediastinal]
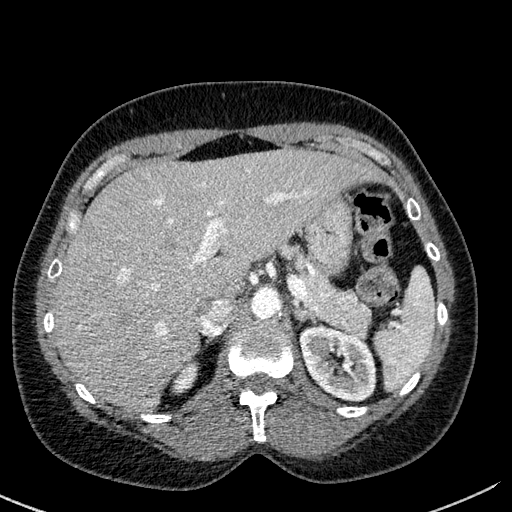
[im 11/146  lung]
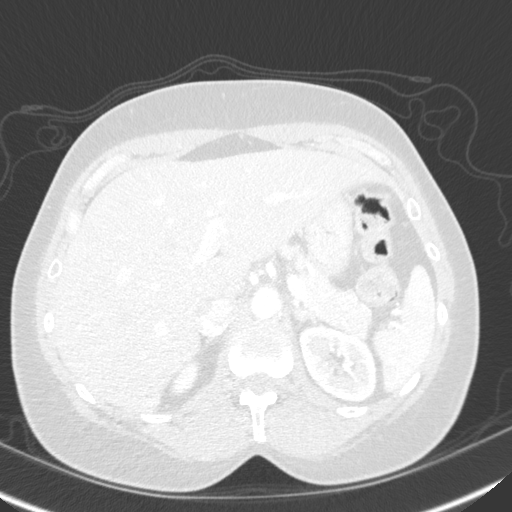
[im 22/146  lung]
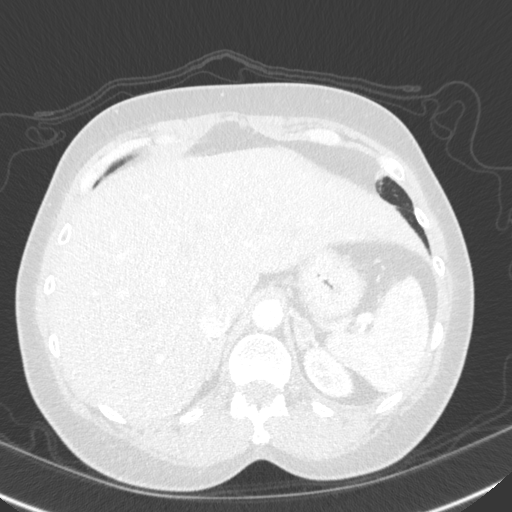
[im 30/146  lung]
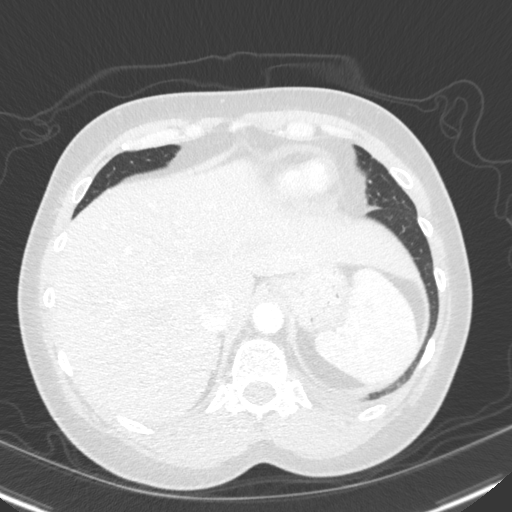
[im 38/146  lung]
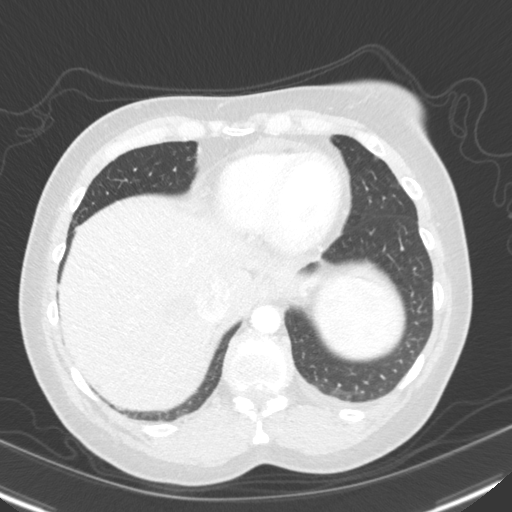
[im 49/146  mediastinal]
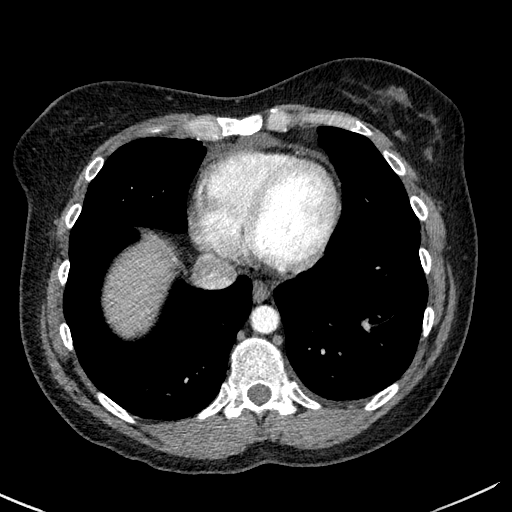
[im 49/146  lung]
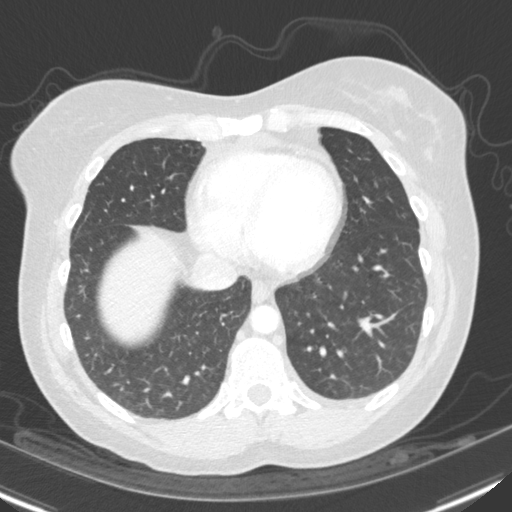
[im 59/146  lung]
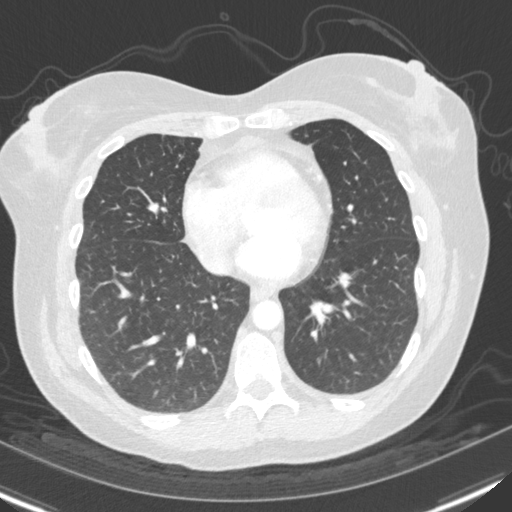
[im 65/146  lung]
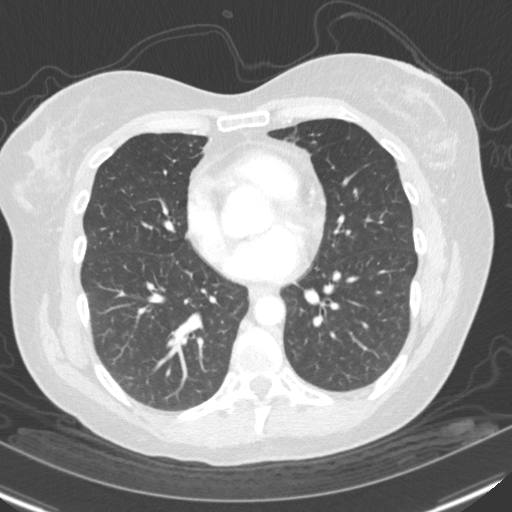
[im 76/146  lung]
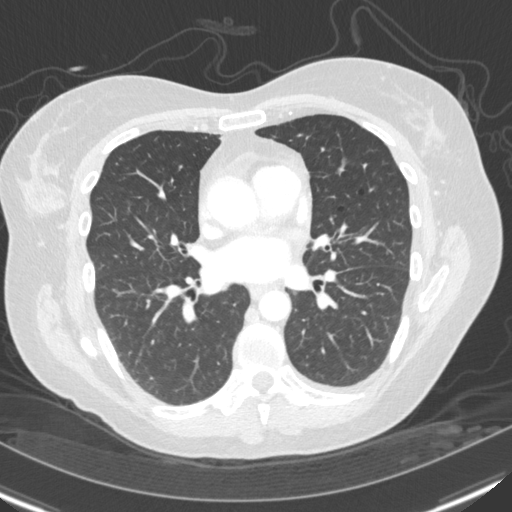
[im 81/146  mediastinal]
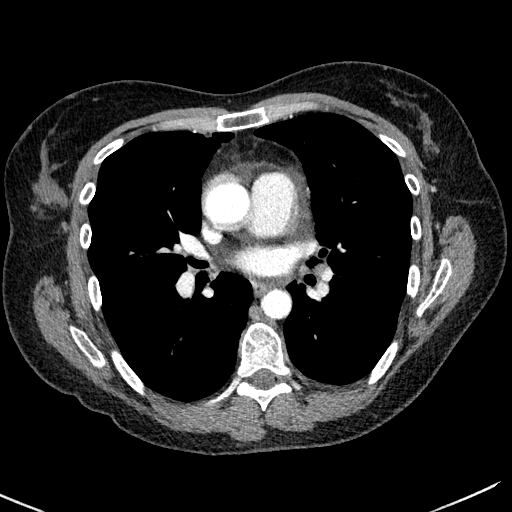
[im 81/146  lung]
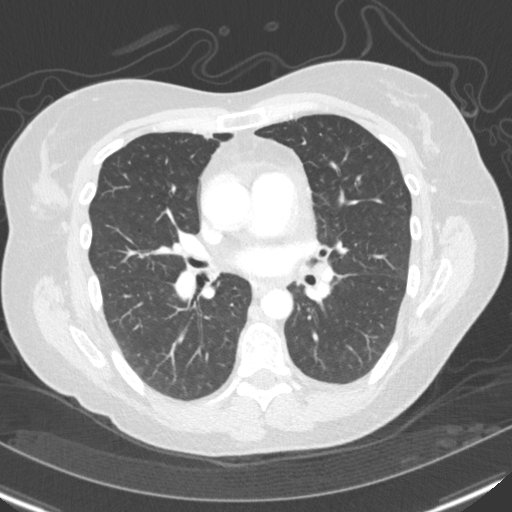
[im 88/146  lung]
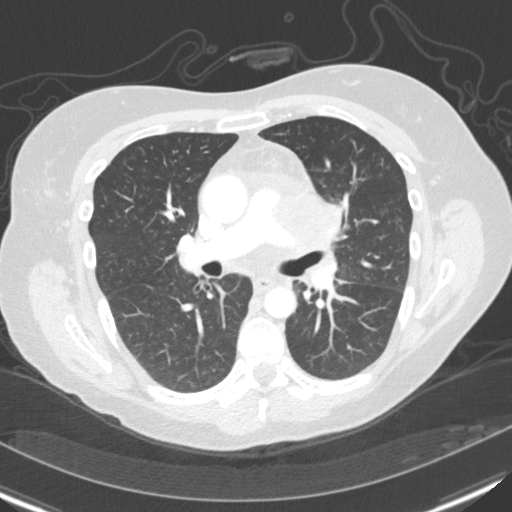
[im 97/146  lung]
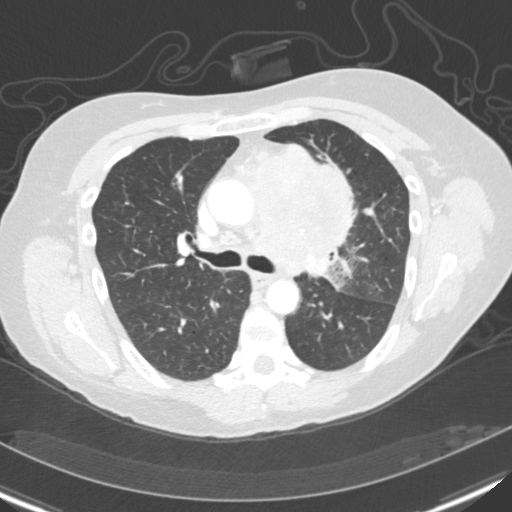
[im 108/146  lung]
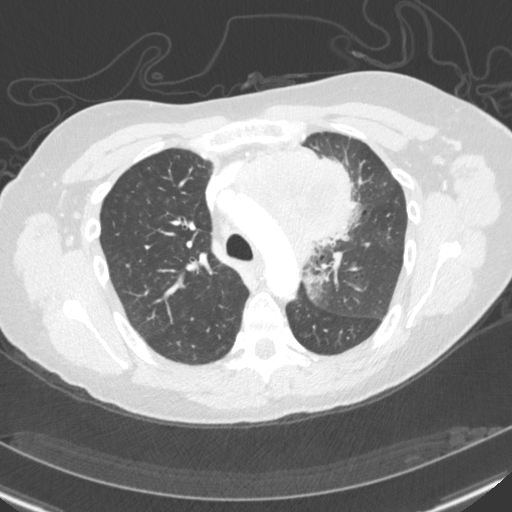
[im 117/146  mediastinal]
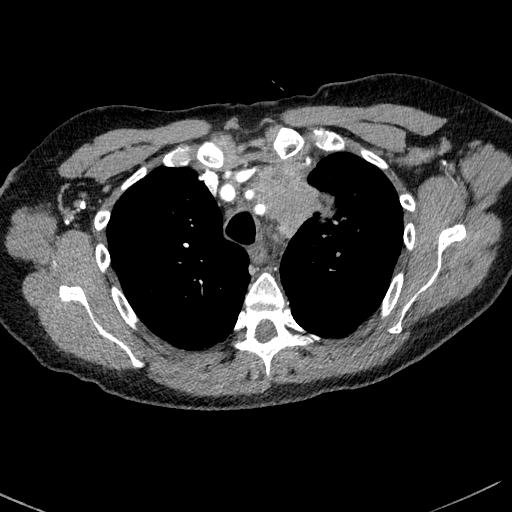
[im 117/146  lung]
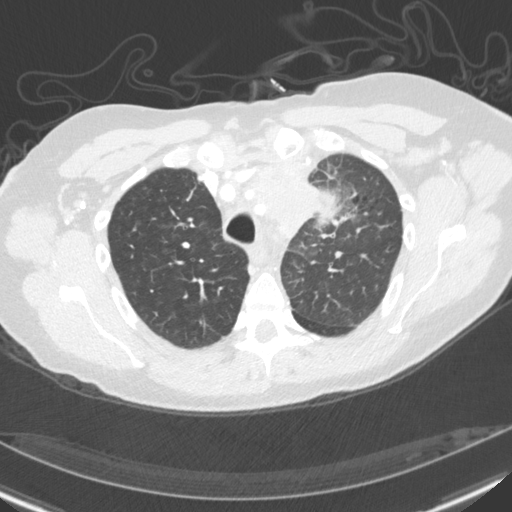
[im 124/146  lung]
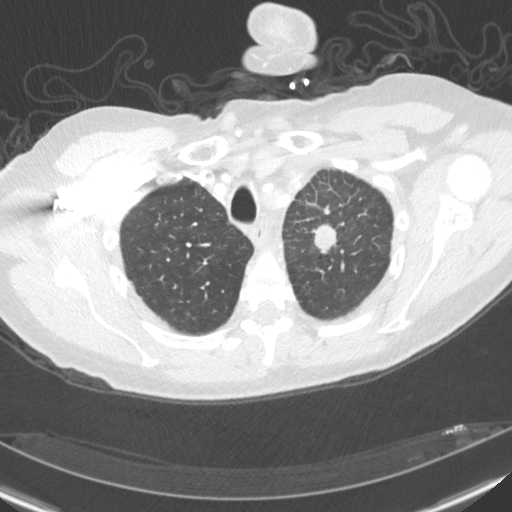
[im 135/146  lung]
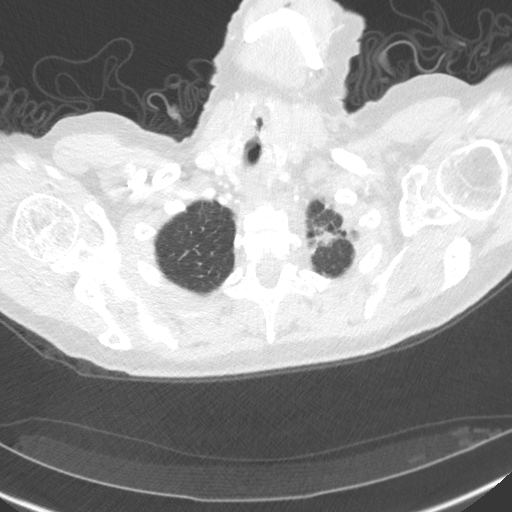

[15 of 34 positions shown; findings below may reference images not displayed]

FINDINGS: Cardiovascular: The thoracic aorta is within normal limits without
aneurysmal dilatation or dissection. Mild atherosclerotic
calcifications are seen. The brachiocephalic vessels are within
normal limits. The pulmonary artery shows no evidence of central
pulmonary embolism although some extrinsic compression upon the left
pulmonary artery is seen. No significant coronary calcifications are
noted.

Mediastinum/Nodes: Thoracic inlet is within normal limits. Within
the superior mediastinum adjacent to the thoracic aorta and
pulmonary artery there is a large 8.1 x 7.3 cm mass lesion with some
central necrotic components. Slight superior extension of this
lesion between the origin of the left subclavian artery and left
common carotid artery is noted. There is subcarinal lymphadenopathy
which measures just under 10 mm in short axis. Additionally a a
right hilar lymph node is noted measuring 10 mm in short axis.

Lungs/Pleura: The lungs are well aerated bilaterally. In the left
upper lobe there is an 18 mm rounded soft tissue mass lesion
consistent with a primary pulmonary neoplasm. No other focal
abnormality is noted.

Upper Abdomen: The upper abdomen demonstrates a 15 mm hypodense
lesion within the left adrenal gland. This likely represents an
adenoma although given the changes in the chest the possibility of
metastatic disease deserves consideration. No other focal
abnormality is noted.

Musculoskeletal: No acute bony abnormality is seen.
IMPRESSION: Left upper lobe mass lesion with large mediastinal component as
described. Additionally subcarinal and right hilar adenopathy is
noted. Further evaluation by means of PET-CT and tissue sampling is
recommended.

Left adrenal lesion likely representing an adenoma although the
possibility of metastatic disease could not be totally excluded.
